# Patient Record
Sex: Female | Born: 1975
Health system: Southern US, Community
[De-identification: ages and names within clinical notes are randomized; demographics above are authoritative.]

## PROBLEM LIST (undated history)

## (undated) DIAGNOSIS — I10 Essential (primary) hypertension: Secondary | ICD-10-CM

## (undated) DIAGNOSIS — G932 Benign intracranial hypertension: Secondary | ICD-10-CM

## (undated) DIAGNOSIS — F419 Anxiety disorder, unspecified: Secondary | ICD-10-CM

## (undated) DIAGNOSIS — R6889 Other general symptoms and signs: Secondary | ICD-10-CM

## (undated) DIAGNOSIS — F329 Major depressive disorder, single episode, unspecified: Secondary | ICD-10-CM

## (undated) DIAGNOSIS — D259 Leiomyoma of uterus, unspecified: Secondary | ICD-10-CM

## (undated) DIAGNOSIS — F32A Depression, unspecified: Secondary | ICD-10-CM

## (undated) HISTORY — DX: Depression, unspecified: F32.A

## (undated) HISTORY — DX: Benign intracranial hypertension: G93.2

## (undated) HISTORY — DX: Anxiety disorder, unspecified: F41.9

## (undated) HISTORY — DX: Essential (primary) hypertension: I10

## (undated) HISTORY — DX: Leiomyoma of uterus, unspecified: D25.9

## (undated) HISTORY — DX: Other general symptoms and signs: R68.89

---
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
## 1999-12-24 HISTORY — PX: TUBAL LIGATION: SHX77

## 2003-12-24 HISTORY — PX: MOUTH SURGERY: SHX715

## 2007-03-31 ENCOUNTER — Ambulatory Visit: Payer: Self-pay | Admitting: Neurology

## 2013-12-23 HISTORY — PX: CHOLECYSTECTOMY: SHX55

## 2016-11-11 DIAGNOSIS — B373 Candidiasis of vulva and vagina: Secondary | ICD-10-CM | POA: Diagnosis not present

## 2016-11-11 DIAGNOSIS — R350 Frequency of micturition: Secondary | ICD-10-CM | POA: Diagnosis not present

## 2017-11-21 DIAGNOSIS — Z113 Encounter for screening for infections with a predominantly sexual mode of transmission: Secondary | ICD-10-CM | POA: Diagnosis not present

## 2017-11-21 DIAGNOSIS — N760 Acute vaginitis: Secondary | ICD-10-CM | POA: Diagnosis not present

## 2017-11-21 DIAGNOSIS — Z118 Encounter for screening for other infectious and parasitic diseases: Secondary | ICD-10-CM | POA: Diagnosis not present

## 2017-12-08 DIAGNOSIS — R42 Dizziness and giddiness: Secondary | ICD-10-CM | POA: Diagnosis not present

## 2017-12-08 DIAGNOSIS — J019 Acute sinusitis, unspecified: Secondary | ICD-10-CM | POA: Diagnosis not present

## 2017-12-08 DIAGNOSIS — J029 Acute pharyngitis, unspecified: Secondary | ICD-10-CM | POA: Diagnosis not present

## 2017-12-08 DIAGNOSIS — G932 Benign intracranial hypertension: Secondary | ICD-10-CM | POA: Diagnosis not present

## 2018-02-16 DIAGNOSIS — D485 Neoplasm of uncertain behavior of skin: Secondary | ICD-10-CM | POA: Diagnosis not present

## 2018-02-16 DIAGNOSIS — L219 Seborrheic dermatitis, unspecified: Secondary | ICD-10-CM | POA: Diagnosis not present

## 2018-02-16 DIAGNOSIS — D229 Melanocytic nevi, unspecified: Secondary | ICD-10-CM | POA: Diagnosis not present

## 2018-10-16 DIAGNOSIS — Z1231 Encounter for screening mammogram for malignant neoplasm of breast: Secondary | ICD-10-CM | POA: Diagnosis not present

## 2019-02-25 DIAGNOSIS — Z0000 Encounter for general adult medical examination without abnormal findings: Secondary | ICD-10-CM | POA: Diagnosis not present

## 2019-02-25 DIAGNOSIS — B351 Tinea unguium: Secondary | ICD-10-CM | POA: Diagnosis not present

## 2019-02-25 DIAGNOSIS — Z6838 Body mass index (BMI) 38.0-38.9, adult: Secondary | ICD-10-CM | POA: Diagnosis not present

## 2019-02-25 DIAGNOSIS — B353 Tinea pedis: Secondary | ICD-10-CM | POA: Diagnosis not present

## 2019-03-24 DIAGNOSIS — R42 Dizziness and giddiness: Secondary | ICD-10-CM | POA: Diagnosis not present

## 2019-03-24 DIAGNOSIS — I10 Essential (primary) hypertension: Secondary | ICD-10-CM | POA: Diagnosis not present

## 2019-05-30 ENCOUNTER — Emergency Department (HOSPITAL_COMMUNITY)
Admission: EM | Admit: 2019-05-30 | Discharge: 2019-05-30 | Disposition: A | Discharge status: Home / Self Care | Payer: BC Managed Care – PPO | Attending: Emergency Medicine | Admitting: Emergency Medicine

## 2019-05-30 ENCOUNTER — Encounter (HOSPITAL_COMMUNITY): Payer: Self-pay | Admitting: *Deleted

## 2019-05-30 ENCOUNTER — Other Ambulatory Visit: Payer: Self-pay

## 2019-05-30 DIAGNOSIS — Y929 Unspecified place or not applicable: Secondary | ICD-10-CM | POA: Diagnosis not present

## 2019-05-30 DIAGNOSIS — Y939 Activity, unspecified: Secondary | ICD-10-CM | POA: Insufficient documentation

## 2019-05-30 DIAGNOSIS — Y999 Unspecified external cause status: Secondary | ICD-10-CM | POA: Insufficient documentation

## 2019-05-30 DIAGNOSIS — S40811A Abrasion of right upper arm, initial encounter: Secondary | ICD-10-CM | POA: Diagnosis not present

## 2019-05-30 DIAGNOSIS — Z23 Encounter for immunization: Secondary | ICD-10-CM | POA: Insufficient documentation

## 2019-05-30 DIAGNOSIS — S40211A Abrasion of right shoulder, initial encounter: Secondary | ICD-10-CM | POA: Diagnosis not present

## 2019-05-30 DIAGNOSIS — X58XXXA Exposure to other specified factors, initial encounter: Secondary | ICD-10-CM | POA: Insufficient documentation

## 2019-05-30 DIAGNOSIS — S4991XA Unspecified injury of right shoulder and upper arm, initial encounter: Secondary | ICD-10-CM | POA: Diagnosis not present

## 2019-05-30 MED ORDER — TETANUS-DIPHTH-ACELL PERTUSSIS 5-2.5-18.5 LF-MCG/0.5 IM SUSP
0.5000 mL | Freq: Once | INTRAMUSCULAR | Status: AC
  Administered 2019-05-30: 0.5 mL via INTRAMUSCULAR
  Filled 2019-05-30: qty 0.5

## 2019-05-30 NOTE — Discharge Instructions (Addendum)
Return if any problems.
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
## 2019-08-06 DIAGNOSIS — N760 Acute vaginitis: Secondary | ICD-10-CM | POA: Diagnosis not present

## 2019-10-07 DIAGNOSIS — Z209 Contact with and (suspected) exposure to unspecified communicable disease: Secondary | ICD-10-CM | POA: Diagnosis not present

## 2019-10-07 DIAGNOSIS — Z6837 Body mass index (BMI) 37.0-37.9, adult: Secondary | ICD-10-CM | POA: Diagnosis not present

## 2019-10-07 DIAGNOSIS — L309 Dermatitis, unspecified: Secondary | ICD-10-CM | POA: Diagnosis not present

## 2019-10-29 DIAGNOSIS — Z1231 Encounter for screening mammogram for malignant neoplasm of breast: Secondary | ICD-10-CM | POA: Diagnosis not present

## 2019-10-30 DIAGNOSIS — Z209 Contact with and (suspected) exposure to unspecified communicable disease: Secondary | ICD-10-CM

## 2019-10-30 HISTORY — DX: Contact with and (suspected) exposure to unspecified communicable disease: Z20.9

## 2019-11-01 ENCOUNTER — Other Ambulatory Visit: Payer: Self-pay

## 2019-11-01 ENCOUNTER — Ambulatory Visit
Admission: EM | Admit: 2019-11-01 | Discharge: 2019-11-01 | Disposition: A | Discharge status: Home / Self Care | Payer: BC Managed Care – PPO

## 2019-11-01 DIAGNOSIS — Z711 Person with feared health complaint in whom no diagnosis is made: Secondary | ICD-10-CM | POA: Diagnosis not present

## 2019-11-01 NOTE — Discharge Instructions (Signed)
Discussed patient case with Dr. Lanny Cramp We are not concerned for rabies at this time because there is no visible bite or puncture wound Follow up with PCP as needed
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
## 2019-11-07 DIAGNOSIS — M25562 Pain in left knee: Secondary | ICD-10-CM | POA: Diagnosis not present

## 2019-11-07 DIAGNOSIS — Z203 Contact with and (suspected) exposure to rabies: Secondary | ICD-10-CM | POA: Diagnosis not present

## 2019-11-07 DIAGNOSIS — Z882 Allergy status to sulfonamides status: Secondary | ICD-10-CM | POA: Diagnosis not present

## 2019-11-07 DIAGNOSIS — Z2914 Encounter for prophylactic rabies immune globin: Secondary | ICD-10-CM | POA: Diagnosis not present

## 2019-11-12 DIAGNOSIS — I889 Nonspecific lymphadenitis, unspecified: Secondary | ICD-10-CM | POA: Diagnosis not present

## 2019-11-12 DIAGNOSIS — Z6837 Body mass index (BMI) 37.0-37.9, adult: Secondary | ICD-10-CM | POA: Diagnosis not present

## 2019-11-19 DIAGNOSIS — R61 Generalized hyperhidrosis: Secondary | ICD-10-CM | POA: Diagnosis not present

## 2019-11-19 DIAGNOSIS — Z203 Contact with and (suspected) exposure to rabies: Secondary | ICD-10-CM | POA: Diagnosis not present

## 2019-11-19 DIAGNOSIS — R531 Weakness: Secondary | ICD-10-CM | POA: Diagnosis not present

## 2019-11-19 DIAGNOSIS — Z2914 Encounter for prophylactic rabies immune globin: Secondary | ICD-10-CM | POA: Diagnosis not present

## 2019-11-19 DIAGNOSIS — Z79899 Other long term (current) drug therapy: Secondary | ICD-10-CM | POA: Diagnosis not present

## 2019-11-19 DIAGNOSIS — F419 Anxiety disorder, unspecified: Secondary | ICD-10-CM | POA: Diagnosis not present

## 2019-11-22 DIAGNOSIS — R251 Tremor, unspecified: Secondary | ICD-10-CM | POA: Diagnosis not present

## 2019-11-22 DIAGNOSIS — Z203 Contact with and (suspected) exposure to rabies: Secondary | ICD-10-CM | POA: Diagnosis not present

## 2019-11-22 DIAGNOSIS — Z2914 Encounter for prophylactic rabies immune globin: Secondary | ICD-10-CM | POA: Diagnosis not present

## 2019-11-22 DIAGNOSIS — Z209 Contact with and (suspected) exposure to unspecified communicable disease: Secondary | ICD-10-CM | POA: Diagnosis not present

## 2019-11-22 DIAGNOSIS — I10 Essential (primary) hypertension: Secondary | ICD-10-CM | POA: Diagnosis not present

## 2019-11-23 ENCOUNTER — Emergency Department (HOSPITAL_COMMUNITY): Payer: BC Managed Care – PPO

## 2019-11-23 ENCOUNTER — Emergency Department (HOSPITAL_COMMUNITY)
Admission: EM | Admit: 2019-11-23 | Discharge: 2019-11-23 | Disposition: A | Discharge status: Home / Self Care | Payer: BC Managed Care – PPO | Attending: Emergency Medicine | Admitting: Emergency Medicine

## 2019-11-23 ENCOUNTER — Other Ambulatory Visit: Payer: Self-pay

## 2019-11-23 ENCOUNTER — Encounter (HOSPITAL_COMMUNITY): Payer: Self-pay

## 2019-11-23 DIAGNOSIS — R6883 Chills (without fever): Secondary | ICD-10-CM | POA: Insufficient documentation

## 2019-11-23 DIAGNOSIS — Z203 Contact with and (suspected) exposure to rabies: Secondary | ICD-10-CM

## 2019-11-23 DIAGNOSIS — Z3202 Encounter for pregnancy test, result negative: Secondary | ICD-10-CM | POA: Diagnosis not present

## 2019-11-23 DIAGNOSIS — R251 Tremor, unspecified: Secondary | ICD-10-CM | POA: Diagnosis not present

## 2019-11-23 HISTORY — DX: Benign intracranial hypertension: G93.2

## 2019-11-23 LAB — COMPREHENSIVE METABOLIC PANEL
ALT: 15 U/L (ref 0–44)
AST: 16 U/L (ref 15–41)
Albumin: 4.1 g/dL (ref 3.5–5.0)
Alkaline Phosphatase: 103 U/L (ref 38–126)
Anion gap: 10 (ref 5–15)
BUN: 10 mg/dL (ref 6–20)
CO2: 22 mmol/L (ref 22–32)
Calcium: 9 mg/dL (ref 8.9–10.3)
Chloride: 106 mmol/L (ref 98–111)
Creatinine, Ser: 0.81 mg/dL (ref 0.44–1.00)
GFR calc Af Amer: >60 mL/min (ref >60)
GFR calc non Af Amer: >60 mL/min (ref >60)
Glucose, Bld: 101 mg/dL — ABNORMAL HIGH (ref 70–99)
Potassium: 3.8 mmol/L (ref 3.5–5.1)
Sodium: 138 mmol/L (ref 135–145)
Total Bilirubin: 0.6 mg/dL (ref 0.3–1.2)
Total Protein: 7.6 g/dL (ref 6.5–8.1)

## 2019-11-23 LAB — URINALYSIS, ROUTINE W REFLEX MICROSCOPIC
Bacteria, UA: NONE SEEN
Bilirubin Urine: NEGATIVE
Glucose, UA: NEGATIVE mg/dL
Ketones, ur: NEGATIVE mg/dL
Leukocytes,Ua: NEGATIVE
Nitrite: NEGATIVE
Protein, ur: NEGATIVE mg/dL
Specific Gravity, Urine: 1.018 (ref 1.005–1.030)
pH: 6 (ref 5.0–8.0)

## 2019-11-23 LAB — CBC WITH DIFFERENTIAL/PLATELET
Abs Immature Granulocytes: 0.02 10*3/uL (ref 0.00–0.07)
Basophils Absolute: 0 10*3/uL (ref 0.0–0.1)
Basophils Relative: 0 %
Eosinophils Absolute: 0.1 10*3/uL (ref 0.0–0.5)
Eosinophils Relative: 1 %
HCT: 41.3 % (ref 36.0–46.0)
Hemoglobin: 13.1 g/dL (ref 12.0–15.0)
Immature Granulocytes: 0 %
Lymphocytes Relative: 13 %
Lymphs Abs: 1.2 10*3/uL (ref 0.7–4.0)
MCH: 26.7 pg (ref 26.0–34.0)
MCHC: 31.7 g/dL (ref 30.0–36.0)
MCV: 84.3 fL (ref 80.0–100.0)
Monocytes Absolute: 0.8 10*3/uL (ref 0.1–1.0)
Monocytes Relative: 9 %
Neutro Abs: 6.6 10*3/uL (ref 1.7–7.7)
Neutrophils Relative %: 77 %
Platelets: 412 10*3/uL — ABNORMAL HIGH (ref 150–400)
RBC: 4.9 MIL/uL (ref 3.87–5.11)
RDW: 14.1 % (ref 11.5–15.5)
WBC: 8.7 10*3/uL (ref 4.0–10.5)
nRBC: 0 % (ref 0.0–0.2)

## 2019-11-23 LAB — LIPASE, BLOOD: Lipase: 23 U/L (ref 11–51)

## 2019-11-23 LAB — PREGNANCY, URINE: Preg Test, Ur: NEGATIVE
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
## 2019-11-23 NOTE — Discharge Instructions (Addendum)
MRI brain without any acute findings.  Labs with a significant abnormalities.  As we discussed infectious disease states that the rabies immunoglobulin that she got would prevent any serious rabies illness and would eradicate it very quickly.  Does recommend you continue the rabies vaccine series.  If the twitching continues would recommend following up with primary care doctor may require neurology referral.  But should not be related to rabies.

## 2019-11-26 DIAGNOSIS — Z203 Contact with and (suspected) exposure to rabies: Secondary | ICD-10-CM | POA: Diagnosis not present

## 2019-11-26 DIAGNOSIS — Z23 Encounter for immunization: Secondary | ICD-10-CM | POA: Diagnosis not present

## 2019-11-29 ENCOUNTER — Other Ambulatory Visit: Payer: Self-pay

## 2019-11-29 ENCOUNTER — Encounter: Payer: Self-pay | Admitting: Neurology

## 2019-11-29 ENCOUNTER — Ambulatory Visit: Payer: BC Managed Care – PPO | Admitting: Neurology

## 2019-11-29 VITALS — BP 131/87 | HR 93 | Temp 97.8°F | Ht 62.0 in | Wt 197.0 lb

## 2019-11-29 DIAGNOSIS — G959 Disease of spinal cord, unspecified: Secondary | ICD-10-CM

## 2019-11-29 DIAGNOSIS — R202 Paresthesia of skin: Secondary | ICD-10-CM | POA: Diagnosis not present

## 2019-11-29 DIAGNOSIS — R7309 Other abnormal glucose: Secondary | ICD-10-CM | POA: Diagnosis not present

## 2019-11-29 DIAGNOSIS — G932 Benign intracranial hypertension: Secondary | ICD-10-CM

## 2019-11-29 DIAGNOSIS — R270 Ataxia, unspecified: Secondary | ICD-10-CM

## 2019-11-29 DIAGNOSIS — R5383 Other fatigue: Secondary | ICD-10-CM

## 2019-11-29 DIAGNOSIS — M6281 Muscle weakness (generalized): Secondary | ICD-10-CM

## 2019-11-29 DIAGNOSIS — R292 Abnormal reflex: Secondary | ICD-10-CM

## 2019-11-29 DIAGNOSIS — R251 Tremor, unspecified: Secondary | ICD-10-CM | POA: Diagnosis not present

## 2019-11-29 DIAGNOSIS — R799 Abnormal finding of blood chemistry, unspecified: Secondary | ICD-10-CM | POA: Diagnosis not present

## 2019-11-29 DIAGNOSIS — Z7721 Contact with and (suspected) exposure to potentially hazardous body fluids: Secondary | ICD-10-CM

## 2019-11-29 DIAGNOSIS — M4712 Other spondylosis with myelopathy, cervical region: Secondary | ICD-10-CM
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
## 2019-11-29 NOTE — Patient Instructions (Signed)
Bloodwork MRI cervical spine

## 2019-12-02 DIAGNOSIS — R531 Weakness: Secondary | ICD-10-CM | POA: Diagnosis not present

## 2019-12-02 DIAGNOSIS — R251 Tremor, unspecified: Secondary | ICD-10-CM | POA: Diagnosis not present

## 2019-12-02 DIAGNOSIS — F419 Anxiety disorder, unspecified: Secondary | ICD-10-CM | POA: Diagnosis not present

## 2019-12-02 DIAGNOSIS — R5383 Other fatigue: Secondary | ICD-10-CM | POA: Diagnosis not present

## 2019-12-03 DIAGNOSIS — Z23 Encounter for immunization: Secondary | ICD-10-CM | POA: Diagnosis not present

## 2019-12-03 DIAGNOSIS — Z203 Contact with and (suspected) exposure to rabies: Secondary | ICD-10-CM | POA: Diagnosis not present

## 2019-12-04 LAB — COMPREHENSIVE METABOLIC PANEL
ALT: 13 IU/L (ref 0–32)
AST: 12 IU/L (ref 0–40)
Albumin/Globulin Ratio: 1.7 (ref 1.2–2.2)
Albumin: 4.3 g/dL (ref 3.8–4.8)
Alkaline Phosphatase: 115 IU/L (ref 39–117)
BUN/Creatinine Ratio: 7 — ABNORMAL LOW (ref 9–23)
BUN: 6 mg/dL (ref 6–24)
Bilirubin Total: 0.3 mg/dL (ref 0.0–1.2)
CO2: 23 mmol/L (ref 20–29)
Calcium: 9.8 mg/dL (ref 8.7–10.2)
Chloride: 105 mmol/L (ref 96–106)
Creatinine, Ser: 0.89 mg/dL (ref 0.57–1.00)
GFR calc Af Amer: 92 mL/min/{1.73_m2} (ref >59)
GFR calc non Af Amer: 80 mL/min/{1.73_m2} (ref >59)
Globulin, Total: 2.6 g/dL (ref 1.5–4.5)
Glucose: 86 mg/dL (ref 65–99)
Potassium: 4.1 mmol/L (ref 3.5–5.2)
Sodium: 141 mmol/L (ref 134–144)
Total Protein: 6.9 g/dL (ref 6.0–8.5)

## 2019-12-04 LAB — CBC
Hematocrit: 38.4 % (ref 34.0–46.6)
Hemoglobin: 12.6 g/dL (ref 11.1–15.9)
MCH: 27 pg (ref 26.6–33.0)
MCHC: 32.8 g/dL (ref 31.5–35.7)
MCV: 82 fL (ref 79–97)
Platelets: 407 10*3/uL (ref 150–450)
RBC: 4.66 x10E6/uL (ref 3.77–5.28)
RDW: 14.5 % (ref 11.7–15.4)
WBC: 10.4 10*3/uL (ref 3.4–10.8)

## 2019-12-04 LAB — HEAVY METALS, BLOOD
Arsenic: 6 ug/L (ref 2–23)
Lead, Blood: <1 ug/dL (ref 0–4)
Mercury: <1 ug/L (ref 0.0–14.9)

## 2019-12-04 LAB — SAR COV2 SEROLOGY (COVID19)AB(IGG),IA: DiaSorin SARS-CoV-2 Ab, IgG: NEGATIVE

## 2019-12-04 LAB — HEPATITIS PANEL, ACUTE
Hep A IgM: NEGATIVE
Hep B C IgM: NEGATIVE
Hep C Virus Ab: <0.1 s/co ratio (ref 0.0–0.9)
Hepatitis B Surface Ag: NEGATIVE

## 2019-12-04 LAB — HEMOGLOBIN A1C
Est. average glucose Bld gHb Est-mCnc: 108 mg/dL
Hgb A1c MFr Bld: 5.4 % (ref 4.8–5.6)

## 2019-12-04 LAB — SEDIMENTATION RATE: Sed Rate: 32 mm/hr (ref 0–32)

## 2019-12-04 LAB — HSV(HERPES SIMPLEX VRS) I + II AB-IGG
HSV 1 Glycoprotein G Ab, IgG: 7.5 index — ABNORMAL HIGH (ref 0.00–0.90)
HSV 2 IgG, Type Spec: <0.91 index (ref 0.00–0.90)

## 2019-12-04 LAB — B12 AND FOLATE PANEL
Folate: 5.3 ng/mL (ref >3.0)
Vitamin B-12: 402 pg/mL (ref 232–1245)

## 2019-12-04 LAB — TSH: TSH: 0.68 u[IU]/mL (ref 0.450–4.500)

## 2019-12-04 LAB — HIV ANTIBODY (ROUTINE TESTING W REFLEX): HIV Screen 4th Generation wRfx: NONREACTIVE

## 2019-12-04 LAB — RPR: RPR Ser Ql: NONREACTIVE

## 2019-12-04 LAB — VITAMIN B1: Thiamine: 108 nmol/L (ref 66.5–200.0)

## 2019-12-04 LAB — VITAMIN B6: Vitamin B6: 3.1 ug/L (ref 2.0–32.8)

## 2019-12-07 DIAGNOSIS — F419 Anxiety disorder, unspecified: Secondary | ICD-10-CM | POA: Diagnosis not present

## 2019-12-07 DIAGNOSIS — R42 Dizziness and giddiness: Secondary | ICD-10-CM | POA: Diagnosis not present

## 2019-12-07 DIAGNOSIS — R000 Tachycardia, unspecified: Secondary | ICD-10-CM | POA: Diagnosis not present

## 2019-12-07 DIAGNOSIS — R531 Weakness: Secondary | ICD-10-CM | POA: Diagnosis not present

## 2019-12-07 DIAGNOSIS — R251 Tremor, unspecified: Secondary | ICD-10-CM | POA: Diagnosis not present

## 2019-12-09 ENCOUNTER — Telehealth: Payer: Self-pay | Admitting: Neurology

## 2019-12-09 NOTE — Telephone Encounter (Signed)
Pt is asking for a call back from [place] re: the MRI on her cervical spine, please call.

## 2019-12-12 ENCOUNTER — Other Ambulatory Visit: Payer: Self-pay

## 2019-12-12 ENCOUNTER — Encounter (HOSPITAL_COMMUNITY): Payer: Self-pay | Admitting: Emergency Medicine

## 2019-12-12 ENCOUNTER — Emergency Department (HOSPITAL_COMMUNITY)
Admission: EM | Admit: 2019-12-12 | Discharge: 2019-12-12 | Disposition: A | Discharge status: Home / Self Care | Payer: BC Managed Care – PPO | Attending: Emergency Medicine | Admitting: Emergency Medicine

## 2019-12-12 DIAGNOSIS — R197 Diarrhea, unspecified: Secondary | ICD-10-CM | POA: Diagnosis not present

## 2019-12-12 DIAGNOSIS — R531 Weakness: Secondary | ICD-10-CM | POA: Insufficient documentation

## 2019-12-12 DIAGNOSIS — Z87891 Personal history of nicotine dependence: Secondary | ICD-10-CM | POA: Diagnosis not present

## 2019-12-12 DIAGNOSIS — Z20828 Contact with and (suspected) exposure to other viral communicable diseases: Secondary | ICD-10-CM | POA: Diagnosis not present

## 2019-12-12 DIAGNOSIS — I10 Essential (primary) hypertension: Secondary | ICD-10-CM | POA: Insufficient documentation

## 2019-12-12 LAB — CBC WITH DIFFERENTIAL/PLATELET
Abs Immature Granulocytes: 0.03 10*3/uL (ref 0.00–0.07)
Basophils Absolute: 0 10*3/uL (ref 0.0–0.1)
Basophils Relative: 0 %
Eosinophils Absolute: 0.2 10*3/uL (ref 0.0–0.5)
Eosinophils Relative: 2 %
HCT: 40.9 % (ref 36.0–46.0)
Hemoglobin: 12.8 g/dL (ref 12.0–15.0)
Immature Granulocytes: 0 %
Lymphocytes Relative: 16 %
Lymphs Abs: 1.5 10*3/uL (ref 0.7–4.0)
MCH: 27 pg (ref 26.0–34.0)
MCHC: 31.3 g/dL (ref 30.0–36.0)
MCV: 86.3 fL (ref 80.0–100.0)
Monocytes Absolute: 0.9 10*3/uL (ref 0.1–1.0)
Monocytes Relative: 9 %
Neutro Abs: 7.3 10*3/uL (ref 1.7–7.7)
Neutrophils Relative %: 73 %
Platelets: 433 10*3/uL — ABNORMAL HIGH (ref 150–400)
RBC: 4.74 MIL/uL (ref 3.87–5.11)
RDW: 14.3 % (ref 11.5–15.5)
WBC: 10 10*3/uL (ref 4.0–10.5)
nRBC: 0 % (ref 0.0–0.2)

## 2019-12-12 LAB — URINALYSIS, ROUTINE W REFLEX MICROSCOPIC
Bilirubin Urine: NEGATIVE
Glucose, UA: NEGATIVE mg/dL
Ketones, ur: 20 mg/dL — AB
Leukocytes,Ua: NEGATIVE
Nitrite: NEGATIVE
Protein, ur: 30 mg/dL — AB
Specific Gravity, Urine: 1.028 (ref 1.005–1.030)
pH: 5 (ref 5.0–8.0)

## 2019-12-12 LAB — BASIC METABOLIC PANEL
Anion gap: 9 (ref 5–15)
BUN: 9 mg/dL (ref 6–20)
CO2: 25 mmol/L (ref 22–32)
Calcium: 9.1 mg/dL (ref 8.9–10.3)
Chloride: 105 mmol/L (ref 98–111)
Creatinine, Ser: 0.86 mg/dL (ref 0.44–1.00)
GFR calc Af Amer: >60 mL/min (ref >60)
GFR calc non Af Amer: >60 mL/min (ref >60)
Glucose, Bld: 96 mg/dL (ref 70–99)
Potassium: 3.9 mmol/L (ref 3.5–5.1)
Sodium: 139 mmol/L (ref 135–145)

## 2019-12-12 LAB — PREGNANCY, URINE: Preg Test, Ur: NEGATIVE

## 2019-12-12 LAB — D-DIMER, QUANTITATIVE: D-Dimer, Quant: 0.36 ug/mL-FEU (ref 0.00–0.50)
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
## 2019-12-12 NOTE — Discharge Instructions (Signed)
Call your primary doctor to schedule a follow-up appointment.
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
## 2019-12-14 ENCOUNTER — Other Ambulatory Visit: Payer: BC Managed Care – PPO

## 2019-12-14 ENCOUNTER — Ambulatory Visit: Payer: BC Managed Care – PPO | Admitting: Diagnostic Neuroimaging

## 2019-12-14 LAB — NOVEL CORONAVIRUS, NAA (HOSP ORDER, SEND-OUT TO REF LAB; TAT 18-24 HRS): SARS-CoV-2, NAA: NOT DETECTED

## 2019-12-14 LAB — URINE CULTURE

## 2019-12-15 DIAGNOSIS — R251 Tremor, unspecified: Secondary | ICD-10-CM | POA: Diagnosis not present

## 2019-12-15 DIAGNOSIS — R002 Palpitations: Secondary | ICD-10-CM | POA: Diagnosis not present

## 2019-12-15 DIAGNOSIS — R112 Nausea with vomiting, unspecified: Secondary | ICD-10-CM | POA: Diagnosis not present

## 2019-12-15 DIAGNOSIS — R110 Nausea: Secondary | ICD-10-CM | POA: Diagnosis not present

## 2019-12-15 DIAGNOSIS — F411 Generalized anxiety disorder: Secondary | ICD-10-CM | POA: Diagnosis not present

## 2019-12-15 DIAGNOSIS — G932 Benign intracranial hypertension: Secondary | ICD-10-CM | POA: Diagnosis not present

## 2019-12-15 DIAGNOSIS — N179 Acute kidney failure, unspecified: Secondary | ICD-10-CM | POA: Diagnosis not present

## 2019-12-15 DIAGNOSIS — R6883 Chills (without fever): Secondary | ICD-10-CM | POA: Diagnosis not present

## 2019-12-15 DIAGNOSIS — R197 Diarrhea, unspecified: Secondary | ICD-10-CM | POA: Diagnosis not present

## 2019-12-15 DIAGNOSIS — I10 Essential (primary) hypertension: Secondary | ICD-10-CM | POA: Diagnosis not present

## 2019-12-21 ENCOUNTER — Telehealth: Payer: Self-pay | Admitting: Neurology

## 2019-12-21 DIAGNOSIS — R251 Tremor, unspecified: Secondary | ICD-10-CM | POA: Diagnosis not present

## 2019-12-21 DIAGNOSIS — Z882 Allergy status to sulfonamides status: Secondary | ICD-10-CM | POA: Diagnosis not present

## 2019-12-21 DIAGNOSIS — R531 Weakness: Secondary | ICD-10-CM | POA: Diagnosis not present

## 2019-12-21 DIAGNOSIS — R42 Dizziness and giddiness: Secondary | ICD-10-CM | POA: Diagnosis not present

## 2019-12-21 DIAGNOSIS — R202 Paresthesia of skin: Secondary | ICD-10-CM | POA: Diagnosis not present

## 2019-12-21 DIAGNOSIS — G252 Other specified forms of tremor: Secondary | ICD-10-CM | POA: Diagnosis not present

## 2019-12-21 DIAGNOSIS — G932 Benign intracranial hypertension: Secondary | ICD-10-CM | POA: Diagnosis not present

## 2019-12-21 DIAGNOSIS — M50221 Other cervical disc displacement at C4-C5 level: Secondary | ICD-10-CM | POA: Diagnosis not present

## 2019-12-21 NOTE — Telephone Encounter (Signed)
I have talked with Dr Sharia Reeve with [facility] ED   Patient presented with left side tremor, weakness all over, movement sensation from heard to her spine. She has LP, open pressure 29 cm H2O.  MRI brain on Nov 23 2019 showed no acute abnormalities.

## 2019-12-28 ENCOUNTER — Other Ambulatory Visit: Payer: Self-pay

## 2019-12-28 ENCOUNTER — Ambulatory Visit (INDEPENDENT_AMBULATORY_CARE_PROVIDER_SITE_OTHER): Payer: BC Managed Care – PPO | Admitting: Internal Medicine

## 2019-12-28 ENCOUNTER — Encounter (INDEPENDENT_AMBULATORY_CARE_PROVIDER_SITE_OTHER): Payer: Self-pay | Admitting: Internal Medicine

## 2019-12-28 ENCOUNTER — Telehealth: Payer: Self-pay

## 2019-12-28 VITALS — BP 150/100 | HR 103 | Ht 62.0 in | Wt 192.8 lb

## 2019-12-28 DIAGNOSIS — G932 Benign intracranial hypertension: Secondary | ICD-10-CM

## 2019-12-28 DIAGNOSIS — R251 Tremor, unspecified: Secondary | ICD-10-CM

## 2019-12-28 NOTE — Telephone Encounter (Signed)
COVID-19 Pre-Screening Questions:  Do you currently have a fever (>100 F), chills or unexplained body aches? NO   Are you currently experiencing new cough, shortness of breath, sore throat, runny nose?NO .  Have you recently travelled outside the state of  in the last 14 days? NO .  Have you been in contact with someone that is currently pending confirmation of Covid19 testing or has been confirmed to have the Covid19 virus?  NO  **If the patient answers NO to ALL questions -  advise the patient to please call the clinic before coming to the office should any symptoms develop.     
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
## 2019-12-29 ENCOUNTER — Encounter: Payer: Self-pay | Admitting: Internal Medicine

## 2019-12-29 ENCOUNTER — Telehealth (INDEPENDENT_AMBULATORY_CARE_PROVIDER_SITE_OTHER): Payer: Self-pay

## 2019-12-29 ENCOUNTER — Ambulatory Visit (INDEPENDENT_AMBULATORY_CARE_PROVIDER_SITE_OTHER): Payer: BC Managed Care – PPO | Admitting: Internal Medicine

## 2019-12-29 ENCOUNTER — Encounter: Payer: Self-pay | Admitting: Neurology

## 2019-12-29 VITALS — BP 134/88 | HR 87 | Temp 98.0°F | Ht 62.0 in | Wt 193.0 lb

## 2019-12-29 DIAGNOSIS — G629 Polyneuropathy, unspecified: Secondary | ICD-10-CM

## 2019-12-29 DIAGNOSIS — Z209 Contact with and (suspected) exposure to unspecified communicable disease: Secondary | ICD-10-CM | POA: Diagnosis not present

## 2019-12-29 DIAGNOSIS — Z20822 Contact with and (suspected) exposure to covid-19: Secondary | ICD-10-CM | POA: Diagnosis not present

## 2019-12-29 NOTE — Telephone Encounter (Signed)
-----   Message from Alfonzo Beers sent at 12/29/2019  1:45 PM EST ----- Regarding: Medication Question Patient called and LVM, she was a new patient yesterday and was referred to a neurologist and can't see them until Feb.  She had a question about her medication and wants to talk to the nurse when you get a chance.
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
## 2019-12-29 NOTE — Progress Notes (Signed)
See note attached with encounter

## 2019-12-29 NOTE — Patient Instructions (Signed)
Referral sent for Neurologist. They will call you to make appointments. Blood work today.  Follow if needed.

## 2019-12-31 NOTE — Telephone Encounter (Signed)
Tell the patient that I am still waiting to look at the note from Dr. Graylon Good, infectious disease and we can discuss changing medications at the next visit.

## 2020-01-03 NOTE — Telephone Encounter (Signed)
Return call to Vanessa Harrington on medication request; Gave her verbal message from Dr Anastasio Champion he is still waiting on the note  from Dr Graylon Good  and will discuss it over next appt in office.

## 2020-01-06 DIAGNOSIS — G932 Benign intracranial hypertension: Secondary | ICD-10-CM | POA: Diagnosis not present

## 2020-01-07 DIAGNOSIS — Z299 Encounter for prophylactic measures, unspecified: Secondary | ICD-10-CM | POA: Diagnosis not present

## 2020-01-07 DIAGNOSIS — Z713 Dietary counseling and surveillance: Secondary | ICD-10-CM | POA: Diagnosis not present

## 2020-01-07 DIAGNOSIS — R251 Tremor, unspecified: Secondary | ICD-10-CM | POA: Diagnosis not present

## 2020-01-07 DIAGNOSIS — Z6833 Body mass index (BMI) 33.0-33.9, adult: Secondary | ICD-10-CM | POA: Diagnosis not present

## 2020-01-07 DIAGNOSIS — Z87891 Personal history of nicotine dependence: Secondary | ICD-10-CM | POA: Diagnosis not present

## 2020-01-10 ENCOUNTER — Telehealth: Payer: Self-pay

## 2020-01-10 LAB — CBC WITH DIFFERENTIAL/PLATELET
Absolute Monocytes: 622 cells/uL (ref 200–950)
Basophils Absolute: 52 cells/uL (ref 0–200)
Basophils Relative: 0.7 %
Eosinophils Absolute: 192 cells/uL (ref 15–500)
Eosinophils Relative: 2.6 %
HCT: 35.8 % (ref 35.0–45.0)
Hemoglobin: 11.7 g/dL (ref 11.7–15.5)
Lymphs Abs: 1310 cells/uL (ref 850–3900)
MCH: 26.9 pg — ABNORMAL LOW (ref 27.0–33.0)
MCHC: 32.7 g/dL (ref 32.0–36.0)
MCV: 82.3 fL (ref 80.0–100.0)
MPV: 10.1 fL (ref 7.5–12.5)
Monocytes Relative: 8.4 %
Neutro Abs: 5224 cells/uL (ref 1500–7800)
Neutrophils Relative %: 70.6 %
Platelets: 410 10*3/uL — ABNORMAL HIGH (ref 140–400)
RBC: 4.35 10*6/uL (ref 3.80–5.10)
RDW: 13.9 % (ref 11.0–15.0)
Total Lymphocyte: 17.7 %
WBC: 7.4 10*3/uL (ref 3.8–10.8)

## 2020-01-10 LAB — RPR: RPR Ser Ql: NONREACTIVE

## 2020-01-10 LAB — SEDIMENTATION RATE: Sed Rate: 22 mm/h — ABNORMAL HIGH (ref 0–20)

## 2020-01-10 LAB — C-REACTIVE PROTEIN: CRP: 1.5 mg/L (ref <8.0)

## 2020-01-10 LAB — SAR COV2 SEROLOGY (COVID19)AB(IGG),IA: SARS CoV2 AB IGG: NEGATIVE

## 2020-01-10 LAB — RABIES VIRUS ANTIBODY TITER: Rabies Response: 0.2 IU/mL

## 2020-01-10 NOTE — Telephone Encounter (Signed)
Patient called office today to follow up on Rabies Antibody lab. Patient would like to know since her level came back a 0.2 would she needs another dose of Rabies vaccine. Will forward message to MD to advise on lab. [place]

## 2020-01-19 ENCOUNTER — Other Ambulatory Visit: Payer: Self-pay

## 2020-01-19 ENCOUNTER — Ambulatory Visit (INDEPENDENT_AMBULATORY_CARE_PROVIDER_SITE_OTHER): Payer: BC Managed Care – PPO | Admitting: Internal Medicine

## 2020-01-19 ENCOUNTER — Encounter (INDEPENDENT_AMBULATORY_CARE_PROVIDER_SITE_OTHER): Payer: Self-pay | Admitting: Internal Medicine

## 2020-01-19 VITALS — BP 140/90 | HR 90 | Temp 98.0°F | Resp 18 | Ht 63.0 in | Wt 188.0 lb

## 2020-01-19 DIAGNOSIS — R251 Tremor, unspecified: Secondary | ICD-10-CM

## 2020-01-19 DIAGNOSIS — F32A Depression, unspecified: Secondary | ICD-10-CM

## 2020-01-19 DIAGNOSIS — F329 Major depressive disorder, single episode, unspecified: Secondary | ICD-10-CM | POA: Diagnosis not present

## 2020-01-19 DIAGNOSIS — F419 Anxiety disorder, unspecified: Secondary | ICD-10-CM | POA: Diagnosis not present

## 2020-01-19 MED ORDER — ESCITALOPRAM OXALATE 5 MG PO TABS: 5.0000 mg | ORAL_TABLET | Freq: Every day | ORAL | 3 refills | Status: DC

## 2020-01-19 MED ORDER — CLONAZEPAM 0.5 MG PO TABS: 0.5000 mg | ORAL_TABLET | Freq: Every day | ORAL | 3 refills | Status: DC
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
## 2020-01-24 ENCOUNTER — Ambulatory Visit: Payer: BC Managed Care – PPO | Admitting: Neurology

## 2020-01-24 LAB — NEUROMYELITIS OPTICA AQP4 AUTO AB

## 2020-01-24 LAB — CMV ABS, IGG+IGM (CYTOMEGALOVIRUS)
CMV IgM: <30 AU/mL
Cytomegalovirus Ab-IgG: <0.6 U/mL

## 2020-01-24 LAB — VARICELLA-ZOSTER BY PCR: VARICELLA ZOSTER VIRUS (VZV) DNA, QL RT PCR: NOT DETECTED

## 2020-01-24 LAB — MYELIN OLIGODENDROCYTE GLYCOPROTEIN(MOG)ABW/RFLX: MOG AB CBAM Serum: NEGATIVE
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
## 2020-01-25 ENCOUNTER — Ambulatory Visit: Payer: BC Managed Care – PPO | Admitting: Neurology

## 2020-01-25 ENCOUNTER — Encounter: Payer: Self-pay | Admitting: Neurology

## 2020-01-25 ENCOUNTER — Other Ambulatory Visit: Payer: Self-pay

## 2020-01-25 VITALS — BP 133/90 | HR 87 | Temp 97.9°F | Ht 63.0 in | Wt 186.0 lb

## 2020-01-25 DIAGNOSIS — G932 Benign intracranial hypertension: Secondary | ICD-10-CM

## 2020-01-25 DIAGNOSIS — H4710 Unspecified papilledema: Secondary | ICD-10-CM

## 2020-01-25 DIAGNOSIS — R251 Tremor, unspecified: Secondary | ICD-10-CM | POA: Diagnosis not present

## 2020-01-25 MED ORDER — FUROSEMIDE 20 MG PO TABS: 20.0000 mg | ORAL_TABLET | ORAL | 3 refills | Status: DC

## 2020-01-25 NOTE — Patient Instructions (Addendum)
Referral back to [facility]  Furosemide Oral Tablets What is this medicine? FUROSEMIDE (fyoor [place] se mide) is a diuretic. It helps you make more urine and to lose salt and excess water from your body. It treats swelling from heart, kidney, or liver disease. It also treats high blood pressure. This medicine may be used for other purposes; ask your health care provider or pharmacist if you have questions. COMMON BRAND NAME(S): Active-Medicated Specimen Kit, Delone, Diuscreen, Lasix, RX Specimen Collection Kit, Specimen Collection Kit, URINX Medicated Specimen Collection What should I tell my health care provider before I take this medicine? They need to know if you have any of these conditions:  abnormal blood electrolytes  diarrhea or vomiting  gout  heart disease  kidney disease, small amounts of urine, or difficulty passing urine  liver disease  thyroid disease  an unusual or allergic reaction to furosemide, sulfa drugs, other medicines, foods, dyes, or preservatives  pregnant or trying to get pregnant  breast-feeding How should I use this medicine? Take this drug by mouth. Take it as directed on the prescription label at the same time every day. You can take it with or without food. If it upsets your stomach, take it with food. Keep taking it unless your health care provider tells you to stop. Talk to your health care provider about the use of this drug in children. Special care may be needed. Overdosage: If you think you have taken too much of this medicine contact a poison control center or emergency room at once. NOTE: This medicine is only for you. Do not share this medicine with others. What if I miss a dose? If you miss a dose, take it as soon as you can. If it is almost time for your next dose, take only that dose. Do not take double or extra doses. What may interact with this medicine?  aspirin and aspirin-like medicines  certain antibiotics  chloral  hydrate  cisplatin  cyclosporine  digoxin  diuretics  laxatives  lithium  medicines for blood pressure  medicines that relax muscles for surgery  methotrexate  NSAIDs, medicines for pain and inflammation like ibuprofen, naproxen, or indomethacin  phenytoin  steroid medicines like prednisone or cortisone  sucralfate  thyroid hormones This list may not describe all possible interactions. Give your health care provider a list of all the medicines, herbs, non-prescription drugs, or dietary supplements you use. Also tell them if you smoke, drink alcohol, or use illegal drugs. Some items may interact with your medicine. What should I watch for while using this medicine? Visit your doctor or health care provider for regular checks on your progress. Check your blood pressure regularly. Ask your doctor or health care provider what your blood pressure should be, and when you should contact him or her. If you are a diabetic, check your blood sugar as directed. This medicine may cause serious skin reactions. They can happen weeks to months after starting the medicine. Contact your health care provider right away if you notice fevers or flu-like symptoms with a rash. The rash may be red or purple and then turn into blisters or peeling of the skin. Or, you might notice a red rash with swelling of the face, lips or lymph nodes in your neck or under your arms. You may need to be on a special diet while taking this medicine. Check with your doctor. Also, ask how many glasses of fluid you need to drink a day. You must not get  dehydrated. You may get drowsy or dizzy. Do not drive, use machinery, or do anything that needs mental alertness until you know how this drug affects you. Do not stand or sit up quickly, especially if you are an older patient. This reduces the risk of dizzy or fainting spells. Alcohol can make you more drowsy and dizzy. Avoid alcoholic drinks. This medicine can make you more  sensitive to the sun. Keep out of the sun. If you cannot avoid being in the sun, wear protective clothing and use sunscreen. Do not use sun lamps or tanning beds/booths. What side effects may I notice from receiving this medicine? Side effects that you should report to your doctor or health care professional as soon as possible:  blood in urine or stools  dry mouth  fever or chills  hearing loss or ringing in the ears  irregular heartbeat  muscle pain or weakness, cramps  rash, fever, and swollen lymph nodes  redness, blistering, peeling or loosening of the skin, including inside the mouth  skin rash  stomach upset, pain, or nausea  tingling or numbness in the hands or feet  unusually weak or tired  vomiting or diarrhea  yellowing of the eyes or skin Side effects that usually do not require medical attention (report to your doctor or health care professional if they continue or are bothersome):  headache  loss of appetite  unusual bleeding or bruising This list may not describe all possible side effects. Call your doctor for medical advice about side effects. You may report side effects to FDA at 1-800-FDA-1088. Where should I keep my medicine? Keep out of the reach of children and pets. Store at room temperature between 20 and 25 degrees C (68 and 77 degrees F). Protect from light and moisture. Keep the container tightly closed. Throw away any unused drug after the expiration date. NOTE: This sheet is a summary. It may not cover all possible information. If you have questions about this medicine, talk to your doctor, pharmacist, or health care provider.  2020 Elsevier/Gold Standard (2019-07-27 18:01:32)

## 2020-01-26 ENCOUNTER — Encounter (INDEPENDENT_AMBULATORY_CARE_PROVIDER_SITE_OTHER): Payer: Self-pay | Admitting: Internal Medicine

## 2020-01-31 ENCOUNTER — Encounter (INDEPENDENT_AMBULATORY_CARE_PROVIDER_SITE_OTHER): Payer: Self-pay | Admitting: Internal Medicine

## 2020-01-31 DIAGNOSIS — R200 Anesthesia of skin: Secondary | ICD-10-CM | POA: Diagnosis not present

## 2020-01-31 DIAGNOSIS — R202 Paresthesia of skin: Secondary | ICD-10-CM | POA: Diagnosis not present

## 2020-01-31 DIAGNOSIS — R930 Abnormal findings on diagnostic imaging of skull and head, not elsewhere classified: Secondary | ICD-10-CM | POA: Diagnosis not present

## 2020-01-31 DIAGNOSIS — H538 Other visual disturbances: Secondary | ICD-10-CM | POA: Diagnosis not present

## 2020-01-31 DIAGNOSIS — M6281 Muscle weakness (generalized): Secondary | ICD-10-CM | POA: Diagnosis not present

## 2020-01-31 DIAGNOSIS — R531 Weakness: Secondary | ICD-10-CM | POA: Diagnosis not present

## 2020-01-31 DIAGNOSIS — G932 Benign intracranial hypertension: Secondary | ICD-10-CM | POA: Diagnosis not present

## 2020-01-31 DIAGNOSIS — R251 Tremor, unspecified: Secondary | ICD-10-CM | POA: Diagnosis not present

## 2020-01-31 DIAGNOSIS — H532 Diplopia: Secondary | ICD-10-CM | POA: Diagnosis not present

## 2020-02-02 DIAGNOSIS — R259 Unspecified abnormal involuntary movements: Secondary | ICD-10-CM | POA: Diagnosis not present

## 2020-02-02 DIAGNOSIS — G932 Benign intracranial hypertension: Secondary | ICD-10-CM | POA: Diagnosis not present

## 2020-02-02 DIAGNOSIS — R251 Tremor, unspecified: Secondary | ICD-10-CM | POA: Diagnosis not present

## 2020-02-07 DIAGNOSIS — F419 Anxiety disorder, unspecified: Secondary | ICD-10-CM | POA: Diagnosis not present

## 2020-02-07 DIAGNOSIS — R251 Tremor, unspecified: Secondary | ICD-10-CM | POA: Diagnosis not present

## 2020-02-07 DIAGNOSIS — R531 Weakness: Secondary | ICD-10-CM | POA: Diagnosis not present

## 2020-02-07 DIAGNOSIS — R5383 Other fatigue: Secondary | ICD-10-CM | POA: Diagnosis not present

## 2020-02-07 DIAGNOSIS — Z6833 Body mass index (BMI) 33.0-33.9, adult: Secondary | ICD-10-CM | POA: Diagnosis not present

## 2020-02-11 ENCOUNTER — Encounter (INDEPENDENT_AMBULATORY_CARE_PROVIDER_SITE_OTHER): Payer: Self-pay | Admitting: Internal Medicine

## 2020-02-13 DIAGNOSIS — R251 Tremor, unspecified: Secondary | ICD-10-CM | POA: Diagnosis not present

## 2020-02-13 DIAGNOSIS — N390 Urinary tract infection, site not specified: Secondary | ICD-10-CM | POA: Diagnosis not present

## 2020-02-13 DIAGNOSIS — Z711 Person with feared health complaint in whom no diagnosis is made: Secondary | ICD-10-CM | POA: Diagnosis not present

## 2020-02-13 DIAGNOSIS — Z888 Allergy status to other drugs, medicaments and biological substances status: Secondary | ICD-10-CM | POA: Diagnosis not present

## 2020-02-13 DIAGNOSIS — Z882 Allergy status to sulfonamides status: Secondary | ICD-10-CM | POA: Diagnosis not present

## 2020-02-13 DIAGNOSIS — R319 Hematuria, unspecified: Secondary | ICD-10-CM | POA: Diagnosis not present

## 2020-02-13 DIAGNOSIS — M6281 Muscle weakness (generalized): Secondary | ICD-10-CM | POA: Diagnosis not present

## 2020-02-13 DIAGNOSIS — G932 Benign intracranial hypertension: Secondary | ICD-10-CM | POA: Diagnosis not present

## 2020-02-13 DIAGNOSIS — G252 Other specified forms of tremor: Secondary | ICD-10-CM | POA: Diagnosis not present

## 2020-02-13 DIAGNOSIS — H539 Unspecified visual disturbance: Secondary | ICD-10-CM | POA: Diagnosis not present

## 2020-02-13 DIAGNOSIS — M791 Myalgia, unspecified site: Secondary | ICD-10-CM | POA: Diagnosis not present

## 2020-02-13 DIAGNOSIS — F449 Dissociative and conversion disorder, unspecified: Secondary | ICD-10-CM | POA: Diagnosis not present

## 2020-02-14 ENCOUNTER — Other Ambulatory Visit (INDEPENDENT_AMBULATORY_CARE_PROVIDER_SITE_OTHER): Payer: Self-pay

## 2020-02-14 DIAGNOSIS — R251 Tremor, unspecified: Secondary | ICD-10-CM

## 2020-02-14 NOTE — Telephone Encounter (Signed)
Would you like me to start the process on freferrals?

## 2020-02-14 NOTE — Progress Notes (Signed)
Pt needed new referral for the following per Neurologist recommendations Dr. Sarina Ill ,Dr .London Pepper ,NP-C ask to send referrals over to the following listed.  1. Neuro Physiatrist, 2. Immunologist. Pt was seen.  Pt also has a appointment setup per Dr Jenny Reichmann to see  Trixie Rude, MD @ [facility] Neurology.

## 2020-02-14 NOTE — Telephone Encounter (Signed)
Sorry he is not our patient.

## 2020-02-14 NOTE — Telephone Encounter (Signed)
Will send now

## 2020-02-16 DIAGNOSIS — G932 Benign intracranial hypertension: Secondary | ICD-10-CM | POA: Diagnosis not present

## 2020-02-18 DIAGNOSIS — H538 Other visual disturbances: Secondary | ICD-10-CM | POA: Diagnosis not present

## 2020-02-18 DIAGNOSIS — R259 Unspecified abnormal involuntary movements: Secondary | ICD-10-CM | POA: Diagnosis not present

## 2020-02-18 DIAGNOSIS — R29818 Other symptoms and signs involving the nervous system: Secondary | ICD-10-CM | POA: Diagnosis not present

## 2020-02-18 DIAGNOSIS — Z882 Allergy status to sulfonamides status: Secondary | ICD-10-CM | POA: Diagnosis not present

## 2020-02-21 DIAGNOSIS — G932 Benign intracranial hypertension: Secondary | ICD-10-CM | POA: Diagnosis not present

## 2020-02-21 DIAGNOSIS — R2689 Other abnormalities of gait and mobility: Secondary | ICD-10-CM | POA: Diagnosis not present

## 2020-02-21 DIAGNOSIS — J343 Hypertrophy of nasal turbinates: Secondary | ICD-10-CM | POA: Diagnosis not present

## 2020-02-21 DIAGNOSIS — R42 Dizziness and giddiness: Secondary | ICD-10-CM | POA: Diagnosis not present

## 2020-02-25 ENCOUNTER — Other Ambulatory Visit: Payer: Self-pay

## 2020-02-28 ENCOUNTER — Encounter: Payer: Self-pay | Admitting: Family Medicine

## 2020-02-28 ENCOUNTER — Other Ambulatory Visit: Payer: Self-pay

## 2020-02-28 ENCOUNTER — Ambulatory Visit: Payer: BC Managed Care – PPO | Admitting: Family Medicine

## 2020-02-28 VITALS — BP 116/71 | HR 90 | Temp 99.1°F | Ht 63.0 in | Wt 179.1 lb

## 2020-02-28 DIAGNOSIS — R509 Fever, unspecified: Secondary | ICD-10-CM | POA: Diagnosis not present

## 2020-02-28 DIAGNOSIS — R251 Tremor, unspecified: Secondary | ICD-10-CM | POA: Diagnosis not present

## 2020-02-28 DIAGNOSIS — M62838 Other muscle spasm: Secondary | ICD-10-CM | POA: Diagnosis not present

## 2020-02-28 MED ORDER — PREDNISONE 20 MG PO TABS: ORAL_TABLET | ORAL | 0 refills | Status: DC
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
## 2020-03-06 LAB — LYME AB/WESTERN BLOT REFLEX
LYME DISEASE AB, QUANT, IGM: <0.8 index (ref 0.00–0.79)
Lyme IgG/IgM Ab: <0.91 {ISR} (ref 0.00–0.90)

## 2020-03-06 LAB — CMP14+EGFR
ALT: 15 IU/L (ref 0–32)
AST: 20 IU/L (ref 0–40)
Albumin/Globulin Ratio: 1.8 (ref 1.2–2.2)
Albumin: 4.2 g/dL (ref 3.8–4.8)
Alkaline Phosphatase: 118 IU/L — ABNORMAL HIGH (ref 39–117)
BUN/Creatinine Ratio: 13 (ref 9–23)
BUN: 9 mg/dL (ref 6–24)
Bilirubin Total: <0.2 mg/dL (ref 0.0–1.2)
CO2: 19 mmol/L — ABNORMAL LOW (ref 20–29)
Calcium: 8.9 mg/dL (ref 8.7–10.2)
Chloride: 108 mmol/L — ABNORMAL HIGH (ref 96–106)
Creatinine, Ser: 0.69 mg/dL (ref 0.57–1.00)
GFR calc Af Amer: 124 mL/min/{1.73_m2} (ref >59)
GFR calc non Af Amer: 108 mL/min/{1.73_m2} (ref >59)
Globulin, Total: 2.4 g/dL (ref 1.5–4.5)
Glucose: 82 mg/dL (ref 65–99)
Potassium: 4.3 mmol/L (ref 3.5–5.2)
Sodium: 141 mmol/L (ref 134–144)
Total Protein: 6.6 g/dL (ref 6.0–8.5)

## 2020-03-06 LAB — CBC WITH DIFFERENTIAL/PLATELET
Basophils Absolute: 0 10*3/uL (ref 0.0–0.2)
Basos: 0 %
EOS (ABSOLUTE): 0.2 10*3/uL (ref 0.0–0.4)
Eos: 3 %
Hematocrit: 34.3 % (ref 34.0–46.6)
Hemoglobin: 11.2 g/dL (ref 11.1–15.9)
Immature Grans (Abs): 0 10*3/uL (ref 0.0–0.1)
Immature Granulocytes: 0 %
Lymphocytes Absolute: 1.4 10*3/uL (ref 0.7–3.1)
Lymphs: 16 %
MCH: 26.5 pg — ABNORMAL LOW (ref 26.6–33.0)
MCHC: 32.7 g/dL (ref 31.5–35.7)
MCV: 81 fL (ref 79–97)
Monocytes Absolute: 0.9 10*3/uL (ref 0.1–0.9)
Monocytes: 10 %
Neutrophils Absolute: 6.4 10*3/uL (ref 1.4–7.0)
Neutrophils: 71 %
Platelets: 407 10*3/uL (ref 150–450)
RBC: 4.22 x10E6/uL (ref 3.77–5.28)
RDW: 14.2 % (ref 11.7–15.4)
WBC: 9 10*3/uL (ref 3.4–10.8)

## 2020-03-06 LAB — AUTOIMMUNE PROFILE
Anti Nuclear Antibody (ANA): NEGATIVE
Complement C3, Serum: 151 mg/dL (ref 82–167)
dsDNA Ab: 1 IU/mL (ref 0–9)

## 2020-03-06 LAB — EPSTEIN-BARR VIRUS (EBV) ANTIBODY PROFILE
EBV NA IgG: 232 U/mL — ABNORMAL HIGH (ref 0.0–17.9)
EBV VCA IgG: 37.4 U/mL — ABNORMAL HIGH (ref 0.0–17.9)
EBV VCA IgM: <36 U/mL (ref 0.0–35.9)

## 2020-03-06 LAB — CMV DNA, QUANTITATIVE, PCR: CMV DNA Quant: NEGATIVE IU/mL

## 2020-03-06 LAB — HLA-B27 ANTIGEN: HLA B27: NEGATIVE

## 2020-03-06 LAB — CYCLIC CITRUL PEPTIDE ANTIBODY, IGG/IGA: Cyclic Citrullin Peptide Ab: 7 units (ref 0–19)

## 2020-03-06 LAB — HIV ANTIBODY (ROUTINE TESTING W REFLEX): HIV Screen 4th Generation wRfx: NONREACTIVE

## 2020-03-06 LAB — ROCKY MTN SPOTTED FVR ABS PNL(IGG+IGM)
RMSF IgG: NEGATIVE
RMSF IgM: 0.72 index (ref 0.00–0.89)

## 2020-03-06 LAB — HEPATITIS C ANTIBODY: Hep C Virus Ab: <0.1 s/co ratio (ref 0.0–0.9)

## 2020-03-06 LAB — RPR: RPR Ser Ql: NONREACTIVE

## 2020-03-06 LAB — SEDIMENTATION RATE: Sed Rate: 19 mm/hr (ref 0–32)

## 2020-03-06 LAB — HEPATITIS B SURFACE ANTIGEN: Hepatitis B Surface Ag: NEGATIVE

## 2020-03-07 ENCOUNTER — Telehealth: Payer: Self-pay | Admitting: *Deleted

## 2020-03-07 ENCOUNTER — Ambulatory Visit (INDEPENDENT_AMBULATORY_CARE_PROVIDER_SITE_OTHER): Payer: BC Managed Care – PPO | Admitting: Nurse Practitioner

## 2020-03-07 NOTE — Telephone Encounter (Signed)
Patient went to primary care as she is still experiencing the same symptoms she saw Dr Baxter Flattery for in January.  PCP drew additional labs.  Patient would like Dr Storm Frisk input on the chronic mono, stating that the steroid pack she was prescribed did not help her. She would like to know other treatment options for chronic mono and how contagious she is. Please advise. Landis Gandy, RN

## 2020-03-08 ENCOUNTER — Telehealth: Payer: Self-pay | Admitting: Family Medicine

## 2020-03-08 NOTE — Telephone Encounter (Signed)
  REFERRAL REQUEST Telephone Note 03/08/2020  What type of referral do you need? Infectious Disease @ [facility]  Have you been seen at our office for this problem? Yes - Tumor discussed with Dr. Livia Snellen. (Advise that they may need an appointment with their PCP before a referral can be done)  Is there a particular doctor or location that you prefer? [facility] Patient notified that referrals can take up to a week or longer to process. If they haven't heard anything within a week they should call back and speak with the referral department.

## 2020-03-09 ENCOUNTER — Other Ambulatory Visit: Payer: Self-pay | Admitting: Family Medicine

## 2020-03-09 DIAGNOSIS — R251 Tremor, unspecified: Secondary | ICD-10-CM

## 2020-03-09 DIAGNOSIS — R509 Fever, unspecified: Secondary | ICD-10-CM

## 2020-03-09 DIAGNOSIS — Z209 Contact with and (suspected) exposure to unspecified communicable disease: Secondary | ICD-10-CM

## 2020-03-10 DIAGNOSIS — G932 Benign intracranial hypertension: Secondary | ICD-10-CM | POA: Diagnosis not present

## 2020-03-12 ENCOUNTER — Encounter: Payer: Self-pay | Admitting: Family Medicine

## 2020-03-16 ENCOUNTER — Telehealth: Payer: Self-pay | Admitting: Family Medicine

## 2020-03-16 NOTE — Telephone Encounter (Signed)
LMYCB

## 2020-03-20 ENCOUNTER — Telehealth: Payer: Self-pay

## 2020-03-20 ENCOUNTER — Other Ambulatory Visit: Payer: Self-pay | Admitting: Family Medicine

## 2020-03-20 MED ORDER — VALACYCLOVIR HCL 1 G PO TABS: 1000.0000 mg | ORAL_TABLET | Freq: Every day | ORAL | 2 refills | Status: DC

## 2020-03-20 NOTE — Telephone Encounter (Signed)
She will have to be seen for treatment. How was mono infection confirmed?

## 2020-03-20 NOTE — Addendum Note (Signed)
Addended by: Wardell Heath on: 03/20/2020 02:26 PM   Modules accepted: Orders

## 2020-03-20 NOTE — Telephone Encounter (Signed)
Ok to send

## 2020-03-20 NOTE — Telephone Encounter (Signed)
Not on med list. Was given this by Dr. Arbie Cookey at [place] back in November, has one tablet left

## 2020-03-20 NOTE — Telephone Encounter (Signed)
Received a call from [facility] with [facility] for cognitive behavior therapy.  States that patient is very anxious about her new dx of chronic mono.  States that patient has been looking up stuff on the Internet and is worried that she will only live if she gets a stem cell transplant.  Patient has apt with Dr. Artemio Aly 4/8 at 3:55pm and wants to discuss it during the visit.  Jennifer aware Dr. Artemio Aly is out of the office and will return next week. States it is fine to wait until the appointment but just wanted him to know to discuss it in the visit.

## 2020-03-20 NOTE — Telephone Encounter (Signed)
Medication sent.  Patient aware

## 2020-03-20 NOTE — Telephone Encounter (Signed)
  REFERRAL REQUEST Telephone Note 03/20/2020  What type of referral do you need? Pt has mono and wants to be referred to an Immunlogist  Have you been seen at our office for this problem? Yes / 3 weeks ago with [facility] (Advise that they may need an appointment with their PCP before a referral can be done)  Is there a particular doctor or location that you prefer? [facility]  Patient notified that referrals can take up to a week or longer to process. If they haven't heard anything within a week they should call back and speak with the referral department.

## 2020-03-24 DIAGNOSIS — G932 Benign intracranial hypertension: Secondary | ICD-10-CM | POA: Diagnosis not present

## 2020-03-24 DIAGNOSIS — R29898 Other symptoms and signs involving the musculoskeletal system: Secondary | ICD-10-CM | POA: Diagnosis not present

## 2020-03-24 DIAGNOSIS — R509 Fever, unspecified: Secondary | ICD-10-CM | POA: Diagnosis not present

## 2020-03-24 DIAGNOSIS — R251 Tremor, unspecified: Secondary | ICD-10-CM | POA: Diagnosis not present

## 2020-03-30 ENCOUNTER — Other Ambulatory Visit: Payer: Self-pay

## 2020-03-30 ENCOUNTER — Ambulatory Visit: Payer: BC Managed Care – PPO | Admitting: Family Medicine

## 2020-03-30 ENCOUNTER — Encounter: Payer: Self-pay | Admitting: Family Medicine

## 2020-03-30 VITALS — BP 130/70 | HR 103 | Temp 99.3°F | Ht 63.0 in | Wt 173.2 lb

## 2020-03-30 DIAGNOSIS — B2701 Gammaherpesviral mononucleosis with polyneuropathy: Secondary | ICD-10-CM

## 2020-03-30 DIAGNOSIS — R251 Tremor, unspecified: Secondary | ICD-10-CM

## 2020-03-30 DIAGNOSIS — G3281 Cerebellar ataxia in diseases classified elsewhere: Secondary | ICD-10-CM

## 2020-03-30 MED ORDER — PREDNISONE 20 MG PO TABS: 40.0000 mg | ORAL_TABLET | Freq: Every day | ORAL | 0 refills | Status: DC
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
## 2020-03-31 ENCOUNTER — Other Ambulatory Visit: Payer: Self-pay | Admitting: *Deleted

## 2020-03-31 ENCOUNTER — Other Ambulatory Visit: Payer: BC Managed Care – PPO

## 2020-03-31 DIAGNOSIS — B2701 Gammaherpesviral mononucleosis with polyneuropathy: Secondary | ICD-10-CM

## 2020-03-31 LAB — CMP14+EGFR
ALT: 26 IU/L (ref 0–32)
AST: 15 IU/L (ref 0–40)
Albumin/Globulin Ratio: 1.6 (ref 1.2–2.2)
Albumin: 4.1 g/dL (ref 3.8–4.8)
Alkaline Phosphatase: 79 IU/L (ref 39–117)
BUN/Creatinine Ratio: 9 (ref 9–23)
BUN: 8 mg/dL (ref 6–24)
Bilirubin Total: 0.3 mg/dL (ref 0.0–1.2)
CO2: 23 mmol/L (ref 20–29)
Calcium: 9.3 mg/dL (ref 8.7–10.2)
Chloride: 106 mmol/L (ref 96–106)
Creatinine, Ser: 0.9 mg/dL (ref 0.57–1.00)
GFR calc Af Amer: 91 mL/min/{1.73_m2} (ref >59)
GFR calc non Af Amer: 79 mL/min/{1.73_m2} (ref >59)
Globulin, Total: 2.5 g/dL (ref 1.5–4.5)
Glucose: 97 mg/dL (ref 65–99)
Potassium: 4 mmol/L (ref 3.5–5.2)
Sodium: 144 mmol/L (ref 134–144)
Total Protein: 6.6 g/dL (ref 6.0–8.5)

## 2020-03-31 LAB — EPSTEIN BARR VRS(EBV DNA BY PCR)

## 2020-03-31 LAB — T + B-LYMPHOCYTE DIFFERENTIAL
% CD 3 Pos. Lymph.: 63 % (ref 57.5–86.2)
% CD 4 Pos. Lymph.: 53.3 % (ref 30.8–58.5)
Absolute CD 3: 945 /uL (ref 622–2402)
Absolute CD 4 Helper: 800 /uL (ref 359–1519)
Basophils Absolute: 0 10*3/uL (ref 0.0–0.2)
Basos: 0 %
CD19 % B Cell: 26 % — ABNORMAL HIGH (ref 3.3–25.4)
CD19 Abs: 390 /uL (ref 12–645)
CD4/CD8 Ratio: 5.33 — ABNORMAL HIGH (ref 0.92–3.72)
CD8 % Suppressor T Cell: 10 % — ABNORMAL LOW (ref 12.0–35.5)
CD8 T Cell Abs: 150 /uL (ref 109–897)
EOS (ABSOLUTE): 0.4 10*3/uL (ref 0.0–0.4)
Eos: 4 %
Hematocrit: 37.3 % (ref 34.0–46.6)
Hemoglobin: 12 g/dL (ref 11.1–15.9)
Immature Grans (Abs): 0.1 10*3/uL (ref 0.0–0.1)
Immature Granulocytes: 1 %
Lymphocytes Absolute: 1.5 10*3/uL (ref 0.7–3.1)
Lymphs: 16 %
MCH: 26.5 pg — ABNORMAL LOW (ref 26.6–33.0)
MCHC: 32.2 g/dL (ref 31.5–35.7)
MCV: 83 fL (ref 79–97)
Monocytes Absolute: 0.9 10*3/uL (ref 0.1–0.9)
Monocytes: 10 %
Neutrophils Absolute: 6.4 10*3/uL (ref 1.4–7.0)
Neutrophils: 69 %
Platelets: 437 10*3/uL (ref 150–450)
RBC: 4.52 x10E6/uL (ref 3.77–5.28)
RDW: 14.9 % (ref 11.7–15.4)
WBC: 9.2 10*3/uL (ref 3.4–10.8)

## 2020-03-31 LAB — SPECIMEN STATUS REPORT

## 2020-04-02 ENCOUNTER — Encounter: Payer: Self-pay | Admitting: Family Medicine

## 2020-04-04 LAB — EPSTEIN BARR VRS(EBV DNA BY PCR): Epstein-Barr DNA Quant, PCR: NEGATIVE copies/mL

## 2020-04-05 DIAGNOSIS — R251 Tremor, unspecified: Secondary | ICD-10-CM | POA: Diagnosis not present

## 2020-04-05 DIAGNOSIS — R531 Weakness: Secondary | ICD-10-CM | POA: Diagnosis not present

## 2020-04-18 DIAGNOSIS — R61 Generalized hyperhidrosis: Secondary | ICD-10-CM | POA: Diagnosis not present

## 2020-04-18 DIAGNOSIS — R251 Tremor, unspecified: Secondary | ICD-10-CM | POA: Diagnosis not present

## 2020-04-18 DIAGNOSIS — R634 Abnormal weight loss: Secondary | ICD-10-CM | POA: Diagnosis not present

## 2020-04-27 ENCOUNTER — Other Ambulatory Visit: Payer: Self-pay

## 2020-04-27 ENCOUNTER — Encounter: Payer: Self-pay | Admitting: Family Medicine

## 2020-04-27 ENCOUNTER — Ambulatory Visit: Payer: BC Managed Care – PPO | Admitting: Family Medicine

## 2020-04-27 VITALS — BP 137/88 | HR 86 | Temp 98.0°F | Resp 18 | Ht 63.0 in | Wt 171.4 lb

## 2020-04-27 DIAGNOSIS — G3281 Cerebellar ataxia in diseases classified elsewhere: Secondary | ICD-10-CM | POA: Diagnosis not present

## 2020-04-27 DIAGNOSIS — R509 Fever, unspecified: Secondary | ICD-10-CM | POA: Diagnosis not present

## 2020-04-27 DIAGNOSIS — B2701 Gammaherpesviral mononucleosis with polyneuropathy: Secondary | ICD-10-CM

## 2020-04-27 DIAGNOSIS — R251 Tremor, unspecified: Secondary | ICD-10-CM | POA: Diagnosis not present

## 2020-04-27 DIAGNOSIS — M62838 Other muscle spasm: Secondary | ICD-10-CM

## 2020-04-27 NOTE — Progress Notes (Signed)
Subjective:  Patient ID: Vanessa Harrington, female    DOB: [birth year]  Age: 44 y.o. MRN: [id number]  CC: Follow-up (1 month)   HPI Vanessa Harrington presents for continued tremors and fatigue.  Symptoms have improved only minimally since her last visit here.  She was been seeing neurology at [facility] in the meantime.  Those notes were reviewed.  A LP has been planned for tomorrow.  She hopes that they will do some new testing rather than just repeating what was done the last time.  She is skeptical about the diagnosis of chronic mono since the confirmation test was negative.  However she is willing to entertain the fact that she is recuperating from a mono from last fall.  This is because her titers are rather high for the IgG even though negative for the IgM early antigens etc.  Additionally she is having fever of unknown origin continue daily 100 to 101 degrees.  She is just generally feeling tired and miserable.  Depression screen [facility] 2/9 03/30/2020 02/28/2020 12/29/2019  Decreased Interest 1 3 0  Down, Depressed, Hopeless 1 3 0  PHQ - 2 Score 2 6 0  Altered sleeping 3 0 -  Tired, decreased energy 1 2 -  Change in appetite 1 1 -  Feeling bad or failure about yourself  1 0 -  Trouble concentrating 1 1 -  Moving slowly or fidgety/restless 0 0 -  Suicidal thoughts 0 0 -  PHQ-9 Score 9 10 -  Difficult doing work/chores Somewhat difficult Very difficult -    History [name] has a past medical history of Anxiety, Blood pressure alteration, Depression, Exposure to bat without known bite (10/30/2019), Hypertension, Increased intracranial pressure, and Pseudotumor cerebri.   She has a past surgical history that includes Cholecystectomy (2015); Cesarean section (1999); Mouth surgery (2005); and Tubal ligation (2001).   Her family history includes Heart Problems in her brother and father; Heart disease in her brother, brother, and father; Hypothyroidism in her mother.She reports that she quit  smoking about 13 years ago. Her smoking use included cigarettes. She has never used smokeless tobacco. She reports that she does not drink alcohol or use drugs.    ROS Review of Systems  Constitutional: Positive for activity change, chills, diaphoresis, fatigue and fever.  HENT: Negative for congestion.   Eyes: Negative for visual disturbance.  Respiratory: Negative for shortness of breath.   Cardiovascular: Negative for chest pain.  Gastrointestinal: Negative for abdominal pain, constipation, diarrhea, nausea and vomiting.  Genitourinary: Negative for difficulty urinating.  Musculoskeletal: Positive for arthralgias and myalgias.  Neurological: Positive for headaches.  Psychiatric/Behavioral: Positive for sleep disturbance.    Objective:  BP 137/88   Pulse 86   Temp 98 F (36.7 C) (Temporal)   Resp 18   Ht 5' 3"  (1.6 m)   Wt 171 lb 6.4 oz (77.7 kg)   SpO2 97%   BMI 30.36 kg/m   BP Readings from Last 3 Encounters:  04/27/20 137/88  03/30/20 130/70  02/28/20 116/71    Wt Readings from Last 3 Encounters:  04/27/20 171 lb 6.4 oz (77.7 kg)  03/30/20 173 lb 3.2 oz (78.6 kg)  02/28/20 179 lb 2 oz (81.3 kg)     Physical Exam Constitutional:      General: She is not in acute distress.    Appearance: She is well-developed.  HENT:     Head: Normocephalic and atraumatic.  Eyes:     Conjunctiva/sclera: Conjunctivae normal.  Pupils: Pupils are equal, round, and reactive to light.  Neck:     Thyroid: No thyromegaly.  Cardiovascular:     Rate and Rhythm: Normal rate and regular rhythm.     Heart sounds: Normal heart sounds. No murmur.  Pulmonary:     Effort: Pulmonary effort is normal. No respiratory distress.     Breath sounds: Normal breath sounds. No wheezing or rales.  Abdominal:     General: There is no distension.     Palpations: Abdomen is soft.     Tenderness: There is no abdominal tenderness.  Musculoskeletal:        General: Normal range of motion.      Cervical back: Normal range of motion and neck supple.  Lymphadenopathy:     Cervical: No cervical adenopathy.  Skin:    General: Skin is warm and dry.  Neurological:     Mental Status: She is alert and oriented to person, place, and time.  Psychiatric:        Behavior: Behavior normal.        Thought Content: Thought content normal.        Judgment: Judgment normal.       Assessment & Plan:   Vanessa Harrington was seen today for follow-up.  Diagnoses and all orders for this visit:  Gammaherpesviral mononucleosis with polyneuropathy -     Upper Respiratory Culture, Routine -     CBC with Differential/Platelet -     CMP14+EGFR -     Arthritis Panel -     CYCLIC CITRUL PEPTIDE ANTIBODY, IGG/IGA -     Anti-DNA antibody, double-stranded -     HLA-B27 antigen -     Sedimentation rate -     Lyme Ab/Western Blot Reflex -     Rocky mtn spotted fvr abs pnl(IgG+IgM) -     Vitamin B12 -     Hepatitis C antibody -     Epstein-Barr virus VCA, IgG -     Epstein-Barr virus VCA, IgM -     Epstein-Barr virus nuclear antigen antibody, IgG -     Epstein-Barr virus early D antigen antibody, IgG  Fever, unspecified fever cause -     Upper Respiratory Culture, Routine -     CBC with Differential/Platelet -     CMP14+EGFR -     Arthritis Panel -     CYCLIC CITRUL PEPTIDE ANTIBODY, IGG/IGA -     Anti-DNA antibody, double-stranded -     HLA-B27 antigen -     Sedimentation rate -     Lyme Ab/Western Blot Reflex -     Rocky mtn spotted fvr abs pnl(IgG+IgM) -     Vitamin B12 -     Hepatitis C antibody -     Epstein-Barr virus VCA, IgG -     Epstein-Barr virus VCA, IgM -     Epstein-Barr virus nuclear antigen antibody, IgG -     Epstein-Barr virus early D antigen antibody, IgG  Tremor of unknown origin -     Upper Respiratory Culture, Routine -     CBC with Differential/Platelet -     CMP14+EGFR -     Arthritis Panel -     CYCLIC CITRUL PEPTIDE ANTIBODY, IGG/IGA -     Anti-DNA antibody,  double-stranded -     HLA-B27 antigen -     Sedimentation rate -     Lyme Ab/Western Blot Reflex -     Rocky mtn spotted fvr abs pnl(IgG+IgM) -  Vitamin B12 -     Hepatitis C antibody -     Epstein-Barr virus VCA, IgG -     Epstein-Barr virus VCA, IgM -     Epstein-Barr virus nuclear antigen antibody, IgG -     Epstein-Barr virus early D antigen antibody, IgG  Tremor of face and hands -     Upper Respiratory Culture, Routine -     CBC with Differential/Platelet -     CMP14+EGFR -     Arthritis Panel -     CYCLIC CITRUL PEPTIDE ANTIBODY, IGG/IGA -     Anti-DNA antibody, double-stranded -     HLA-B27 antigen -     Sedimentation rate -     Lyme Ab/Western Blot Reflex -     Rocky mtn spotted fvr abs pnl(IgG+IgM) -     Vitamin B12 -     Hepatitis C antibody -     Epstein-Barr virus VCA, IgG -     Epstein-Barr virus VCA, IgM -     Epstein-Barr virus nuclear antigen antibody, IgG -     Epstein-Barr virus early D antigen antibody, IgG  Cerebellar ataxia in diseases classified elsewhere ([place]) -     Upper Respiratory Culture, Routine -     CBC with Differential/Platelet -     CMP14+EGFR -     Arthritis Panel -     CYCLIC CITRUL PEPTIDE ANTIBODY, IGG/IGA -     Anti-DNA antibody, double-stranded -     HLA-B27 antigen -     Sedimentation rate -     Lyme Ab/Western Blot Reflex -     Rocky mtn spotted fvr abs pnl(IgG+IgM) -     Vitamin B12 -     Hepatitis C antibody -     Epstein-Barr virus VCA, IgG -     Epstein-Barr virus VCA, IgM -     Epstein-Barr virus nuclear antigen antibody, IgG -     Epstein-Barr virus early D antigen antibody, IgG  Muscle spasm -     Upper Respiratory Culture, Routine -     CBC with Differential/Platelet -     CMP14+EGFR -     Arthritis Panel -     CYCLIC CITRUL PEPTIDE ANTIBODY, IGG/IGA -     Anti-DNA antibody, double-stranded -     HLA-B27 antigen -     Sedimentation rate -     Lyme Ab/Western Blot Reflex -     Rocky mtn spotted fvr abs  pnl(IgG+IgM) -     Vitamin B12 -     Hepatitis C antibody -     Epstein-Barr virus VCA, IgG -     Epstein-Barr virus VCA, IgM -     Epstein-Barr virus nuclear antigen antibody, IgG -     Epstein-Barr virus early D antigen antibody, IgG       I am having Vanessa Harrington maintain her escitalopram, clonazePAM, valACYclovir, and predniSONE.  Allergies as of 04/27/2020      Reactions   Diamox [acetazolamide] Rash   Due to sulfa allergy   Sulfa Antibiotics Rash      Medication List       Accurate as of Apr 27, 2020 11:59 PM. If you have any questions, ask your nurse or doctor.        clonazePAM 0.5 MG tablet Commonly known as: KLONOPIN Take 1 tablet (0.5 mg total) by mouth at bedtime.   escitalopram 5 MG tablet Commonly known as: Lexapro Take 1 tablet (5 mg  total) by mouth daily.   predniSONE 20 MG tablet Commonly known as: DELTASONE Take 2 tablets (40 mg total) by mouth daily with breakfast.   valACYclovir 1000 MG tablet Commonly known as: VALTREX Take 1 tablet (1,000 mg total) by mouth daily.        Follow-up: Return in about 1 month (around 05/28/2020).  Claretta Fraise, M.D.

## 2020-04-28 DIAGNOSIS — G3281 Cerebellar ataxia in diseases classified elsewhere: Secondary | ICD-10-CM | POA: Diagnosis not present

## 2020-04-28 DIAGNOSIS — B2701 Gammaherpesviral mononucleosis with polyneuropathy: Secondary | ICD-10-CM | POA: Diagnosis not present

## 2020-04-28 DIAGNOSIS — R251 Tremor, unspecified: Secondary | ICD-10-CM | POA: Diagnosis not present

## 2020-04-28 DIAGNOSIS — R509 Fever, unspecified: Secondary | ICD-10-CM | POA: Diagnosis not present

## 2020-04-28 DIAGNOSIS — R634 Abnormal weight loss: Secondary | ICD-10-CM | POA: Diagnosis not present

## 2020-04-28 DIAGNOSIS — R61 Generalized hyperhidrosis: Secondary | ICD-10-CM | POA: Diagnosis not present

## 2020-04-28 DIAGNOSIS — G932 Benign intracranial hypertension: Secondary | ICD-10-CM | POA: Diagnosis not present

## 2020-04-29 DIAGNOSIS — G932 Benign intracranial hypertension: Secondary | ICD-10-CM | POA: Diagnosis not present

## 2020-04-29 DIAGNOSIS — R251 Tremor, unspecified: Secondary | ICD-10-CM | POA: Diagnosis not present

## 2020-04-29 DIAGNOSIS — G9608 Other cranial cerebrospinal fluid leak: Secondary | ICD-10-CM | POA: Diagnosis not present

## 2020-04-29 DIAGNOSIS — M5030 Other cervical disc degeneration, unspecified cervical region: Secondary | ICD-10-CM | POA: Diagnosis not present

## 2020-04-30 LAB — UPPER RESPIRATORY CULTURE, ROUTINE

## 2020-05-01 ENCOUNTER — Encounter: Payer: Self-pay | Admitting: Family Medicine

## 2020-05-04 LAB — CMP14+EGFR
ALT: 21 IU/L (ref 0–32)
AST: 21 IU/L (ref 0–40)
Albumin/Globulin Ratio: 1.7 (ref 1.2–2.2)
Albumin: 4.2 g/dL (ref 3.8–4.8)
Alkaline Phosphatase: 112 IU/L (ref 39–117)
BUN/Creatinine Ratio: 13 (ref 9–23)
BUN: 9 mg/dL (ref 6–24)
Bilirubin Total: <0.2 mg/dL (ref 0.0–1.2)
CO2: 21 mmol/L (ref 20–29)
Calcium: 9.5 mg/dL (ref 8.7–10.2)
Chloride: 106 mmol/L (ref 96–106)
Creatinine, Ser: 0.7 mg/dL (ref 0.57–1.00)
GFR calc Af Amer: 124 mL/min/{1.73_m2} (ref >59)
GFR calc non Af Amer: 107 mL/min/{1.73_m2} (ref >59)
Globulin, Total: 2.5 g/dL (ref 1.5–4.5)
Glucose: 88 mg/dL (ref 65–99)
Potassium: 4.8 mmol/L (ref 3.5–5.2)
Sodium: 142 mmol/L (ref 134–144)
Total Protein: 6.7 g/dL (ref 6.0–8.5)

## 2020-05-04 LAB — ARTHRITIS PANEL
Basophils Absolute: 0 10*3/uL (ref 0.0–0.2)
Basos: 0 %
EOS (ABSOLUTE): 0.3 10*3/uL (ref 0.0–0.4)
Eos: 3 %
Hematocrit: 35.2 % (ref 34.0–46.6)
Hemoglobin: 11.2 g/dL (ref 11.1–15.9)
Immature Grans (Abs): 0 10*3/uL (ref 0.0–0.1)
Immature Granulocytes: 0 %
Lymphocytes Absolute: 1.5 10*3/uL (ref 0.7–3.1)
Lymphs: 16 %
MCH: 26.1 pg — ABNORMAL LOW (ref 26.6–33.0)
MCHC: 31.8 g/dL (ref 31.5–35.7)
MCV: 82 fL (ref 79–97)
Monocytes Absolute: 0.9 10*3/uL (ref 0.1–0.9)
Monocytes: 10 %
Neutrophils Absolute: 6.6 10*3/uL (ref 1.4–7.0)
Neutrophils: 71 %
Platelets: 446 10*3/uL (ref 150–450)
RBC: 4.29 x10E6/uL (ref 3.77–5.28)
RDW: 15.2 % (ref 11.7–15.4)
Rheumatoid fact SerPl-aCnc: <10 IU/mL (ref 0.0–13.9)
Sed Rate: 40 mm/hr — ABNORMAL HIGH (ref 0–32)
Uric Acid: 4.2 mg/dL (ref 2.6–6.2)
WBC: 9.3 10*3/uL (ref 3.4–10.8)

## 2020-05-04 LAB — CYCLIC CITRUL PEPTIDE ANTIBODY, IGG/IGA: Cyclic Citrullin Peptide Ab: 6 units (ref 0–19)

## 2020-05-04 LAB — HEPATITIS C ANTIBODY: Hep C Virus Ab: <0.1 s/co ratio (ref 0.0–0.9)

## 2020-05-04 LAB — ANTI-DNA ANTIBODY, DOUBLE-STRANDED: dsDNA Ab: 1 IU/mL (ref 0–9)

## 2020-05-04 LAB — LYME AB/WESTERN BLOT REFLEX
LYME DISEASE AB, QUANT, IGM: <0.8 index (ref 0.00–0.79)
Lyme IgG/IgM Ab: <0.91 {ISR} (ref 0.00–0.90)

## 2020-05-04 LAB — EPSTEIN-BARR VIRUS VCA, IGM: EBV VCA IgM: <36 U/mL (ref 0.0–35.9)

## 2020-05-04 LAB — ROCKY MTN SPOTTED FVR ABS PNL(IGG+IGM)
RMSF IgG: NEGATIVE
RMSF IgM: 0.42 index (ref 0.00–0.89)

## 2020-05-04 LAB — EPSTEIN-BARR VIRUS NUCLEAR ANTIGEN ANTIBODY, IGG: EBV NA IgG: 208 U/mL — ABNORMAL HIGH (ref 0.0–17.9)

## 2020-05-04 LAB — VITAMIN B12: Vitamin B-12: 413 pg/mL (ref 232–1245)

## 2020-05-04 LAB — EPSTEIN-BARR VIRUS VCA, IGG: EBV VCA IgG: 39.1 U/mL — ABNORMAL HIGH (ref 0.0–17.9)

## 2020-05-04 LAB — EPSTEIN-BARR VIRUS EARLY D ANTIGEN ANTIBODY, IGG: EBV Early Antigen Ab, IgG: <9 U/mL (ref 0.0–8.9)

## 2020-05-04 LAB — HLA-B27 ANTIGEN: HLA B27: NEGATIVE

## 2020-05-19 ENCOUNTER — Telehealth: Payer: Self-pay | Admitting: Family Medicine

## 2020-05-19 NOTE — Telephone Encounter (Signed)
  Prescription Request  05/19/2020  What is the name of the medication or equipment? prednisone  Have you contacted your pharmacy to request a refill? (if applicable) no  Which pharmacy would you like this sent to? [facility] in [place]   Patient notified that their request is being sent to the clinical staff for review and that they should receive a response within 2 business days.

## 2020-05-19 NOTE — Telephone Encounter (Signed)
Sick since Nov 2020 Inflammation in body - has chronic MONO   Dr Livia Snellen had advised her to cont taking Prednisone. (see last Office note)   Vanessa Harrington - [facility]

## 2020-05-19 NOTE — Telephone Encounter (Signed)
LM for pt = we need more info on WHY she needs Prednisone ( had a recent RX for this)

## 2020-05-19 NOTE — Telephone Encounter (Signed)
What does patient need prednisone for

## 2020-05-23 ENCOUNTER — Other Ambulatory Visit: Payer: Self-pay | Admitting: Family Medicine

## 2020-05-23 DIAGNOSIS — B2701 Gammaherpesviral mononucleosis with polyneuropathy: Secondary | ICD-10-CM

## 2020-05-23 MED ORDER — PREDNISONE 20 MG PO TABS: 40.0000 mg | ORAL_TABLET | Freq: Every day | ORAL | 0 refills | Status: DC

## 2020-06-13 ENCOUNTER — Ambulatory Visit: Payer: BC Managed Care – PPO | Admitting: Family Medicine

## 2020-06-13 ENCOUNTER — Encounter: Payer: Self-pay | Admitting: Family Medicine

## 2020-06-13 ENCOUNTER — Ambulatory Visit (INDEPENDENT_AMBULATORY_CARE_PROVIDER_SITE_OTHER): Payer: Self-pay | Admitting: Family Medicine

## 2020-06-13 ENCOUNTER — Other Ambulatory Visit: Payer: Self-pay

## 2020-06-13 VITALS — BP 136/82 | HR 86 | Temp 98.1°F | Resp 20 | Ht 63.0 in | Wt 169.2 lb

## 2020-06-13 DIAGNOSIS — G3281 Cerebellar ataxia in diseases classified elsewhere: Secondary | ICD-10-CM

## 2020-06-13 DIAGNOSIS — R5382 Chronic fatigue, unspecified: Secondary | ICD-10-CM | POA: Diagnosis not present

## 2020-06-13 DIAGNOSIS — B2701 Gammaherpesviral mononucleosis with polyneuropathy: Secondary | ICD-10-CM

## 2020-06-13 MED ORDER — PREDNISONE 20 MG PO TABS: ORAL_TABLET | ORAL | 1 refills | Status: DC

## 2020-06-13 MED ORDER — DIVALPROEX SODIUM ER 500 MG PO TB24: 500.0000 mg | ORAL_TABLET | Freq: Two times a day (BID) | ORAL | 1 refills | Status: DC

## 2020-06-13 NOTE — Progress Notes (Signed)
Subjective:  Patient ID: Vanessa Harrington, female    DOB: [birth year]  Age: 44 y.o. MRN: [id number]  CC: One month follow up   HPI Vanessa Harrington presents for continuing to shake in her head and hands and feet.  She is not had any improvement this month and her muscles are trembling and twitching.  Her eyes were swelling and she is having a cakey discharge.  She is seeing 3 eye doctors who did not want to culture it.  And she feels like she is got bugs crawling all over her legs.  She went through a lumbar puncture a she is concerned they did not do anything different than what had been done here already.  She is starting occupational therapy next month.  Depression screen [facility] 2/9 06/13/2020 06/13/2020 03/30/2020  Decreased Interest 3 3 1   Down, Depressed, Hopeless 3 3 1   PHQ - 2 Score 6 6 2   Altered sleeping 3 - 3  Tired, decreased energy 3 - 1  Change in appetite 3 - 1  Feeling bad or failure about yourself  0 - 1  Trouble concentrating 3 - 1  Moving slowly or fidgety/restless 2 - 0  Suicidal thoughts 0 - 0  PHQ-9 Score 20 - 9  Difficult doing work/chores Extremely dIfficult - Somewhat difficult    History Alayza has a past medical history of Anxiety, Blood pressure alteration, Depression, Exposure to bat without known bite (10/30/2019), Hypertension, Increased intracranial pressure, and Pseudotumor cerebri.   She has a past surgical history that includes Cholecystectomy (2015); Cesarean section (1999); Mouth surgery (2005); and Tubal ligation (2001).   Her family history includes Heart Problems in her brother and father; Heart disease in her brother, brother, and father; Hypothyroidism in her mother.She reports that she quit smoking about 13 years ago. Her smoking use included cigarettes. She has never used smokeless tobacco. She reports that she does not drink alcohol and does not use drugs.    ROS Review of Systems  Constitutional: Negative for fever.  HENT: Negative for  congestion, rhinorrhea and sore throat.   Respiratory: Negative for cough and shortness of breath.   Cardiovascular: Negative for chest pain and palpitations.  Gastrointestinal: Negative for abdominal pain.  Musculoskeletal: Positive for myalgias. Negative for arthralgias.  Neurological: Positive for tremors.    Objective:  BP 136/82   Pulse 86   Temp 98.1 F (36.7 C) (Temporal)   Resp 20   Ht 5\' 3"  (1.6 m)   Wt 169 lb 4 oz (76.8 kg)   SpO2 97%   BMI 29.98 kg/m   BP Readings from Last 3 Encounters:  06/13/20 136/82  04/27/20 137/88  03/30/20 130/70    Wt Readings from Last 3 Encounters:  06/13/20 169 lb 4 oz (76.8 kg)  04/27/20 171 lb 6.4 oz (77.7 kg)  03/30/20 173 lb 3.2 oz (78.6 kg)     Physical Exam Constitutional:      Appearance: She is well-developed and normal weight. She is ill-appearing.  HENT:     Head: Normocephalic and atraumatic.  Eyes:     Conjunctiva/sclera: Conjunctivae normal.     Pupils: Pupils are equal, round, and reactive to light.  Neck:     Thyroid: No thyromegaly.  Cardiovascular:     Rate and Rhythm: Normal rate and regular rhythm.     Heart sounds: Normal heart sounds. No murmur heard.   Pulmonary:     Effort: Pulmonary effort is normal. No respiratory distress.  Breath sounds: Normal breath sounds. No wheezing or rales.  Abdominal:     General: Bowel sounds are normal. There is no distension.     Palpations: Abdomen is soft.     Tenderness: There is no abdominal tenderness.  Musculoskeletal:        General: Normal range of motion.     Cervical back: Normal range of motion and neck supple.  Lymphadenopathy:     Cervical: No cervical adenopathy.  Skin:    General: Skin is warm and dry.  Neurological:     Mental Status: She is alert and oriented to person, place, and time.     Coordination: Coordination abnormal.  Psychiatric:        Mood and Affect: Mood is anxious and depressed.        Behavior: Behavior is agitated.  Behavior is cooperative.        Thought Content: Thought content normal.        Judgment: Judgment normal.       Assessment & Plan:   Lerin was seen today for one month follow up.  Diagnoses and all orders for this visit:  Gammaherpesviral mononucleosis with polyneuropathy -     predniSONE (DELTASONE) 20 MG tablet; Take 1.5 daily for one week then one daily  Cerebellar ataxia in diseases classified elsewhere ([place]) -     HIV Antibody (routine testing w rflx) -     Antistreptolysin O titer -     Allergen Profile, Mold  Chronic fatigue -     HIV Antibody (routine testing w rflx) -     Antistreptolysin O titer -     Allergen Profile, Mold  Other orders -     divalproex (DEPAKOTE ER) 500 MG 24 hr tablet; Take 1 tablet (500 mg total) by mouth in the morning and at bedtime. For the first week take just one each day in the evening.       I have discontinued [place] [facility]. Phagan's valACYclovir. I have also changed her predniSONE. Additionally, I am having her start on divalproex. Lastly, I am having her maintain her escitalopram and clonazePAM.  Allergies as of 06/13/2020      Reactions   Diamox [acetazolamide] Rash   Due to sulfa allergy   Sulfa Antibiotics Rash      Medication List       Accurate as of June 13, 2020  9:49 PM. If you have any questions, ask your nurse or doctor.        STOP taking these medications   valACYclovir 1000 MG tablet Commonly known as: VALTREX Stopped by: Claretta Fraise, MD     TAKE these medications   clonazePAM 0.5 MG tablet Commonly known as: KLONOPIN Take 1 tablet (0.5 mg total) by mouth at bedtime.   divalproex 500 MG 24 hr tablet Commonly known as: Depakote ER Take 1 tablet (500 mg total) by mouth in the morning and at bedtime. For the first week take just one each day in the evening. Started by: Claretta Fraise, MD   escitalopram 5 MG tablet Commonly known as: Lexapro Take 1 tablet (5 mg total) by mouth daily.   predniSONE  20 MG tablet Commonly known as: DELTASONE Take 1.5 daily for one week then one daily What changed:   how much to take  how to take this  when to take this  additional instructions Changed by: Claretta Fraise, MD        Follow-up: Return in about 1 month (around  07/13/2020).  Claretta Fraise, M.D.

## 2020-06-17 LAB — ALLERGEN PROFILE, MOLD
Alternaria Alternata IgE: <0.1 kU/L
Aspergillus Fumigatus IgE: <0.1 kU/L
Aureobasidi Pullulans IgE: <0.1 kU/L
Candida Albicans IgE: <0.1 kU/L
Cladosporium Herbarum IgE: <0.1 kU/L
M009-IgE Fusarium proliferatum: <0.1 kU/L
M014-IgE Epicoccum purpur: <0.1 kU/L
Mucor Racemosus IgE: <0.1 kU/L
Penicillium Chrysogen IgE: <0.1 kU/L
Phoma Betae IgE: <0.1 kU/L
Setomelanomma Rostrat: <0.1 kU/L
Stemphylium Herbarum IgE: <0.1 kU/L

## 2020-06-17 LAB — ANTISTREPTOLYSIN O TITER: ASO: 162 IU/mL (ref 0.0–200.0)

## 2020-06-17 LAB — HIV ANTIBODY (ROUTINE TESTING W REFLEX): HIV Screen 4th Generation wRfx: NONREACTIVE

## 2020-06-27 DIAGNOSIS — R279 Unspecified lack of coordination: Secondary | ICD-10-CM | POA: Diagnosis not present

## 2020-06-27 DIAGNOSIS — R2689 Other abnormalities of gait and mobility: Secondary | ICD-10-CM | POA: Diagnosis not present

## 2020-06-27 DIAGNOSIS — R262 Difficulty in walking, not elsewhere classified: Secondary | ICD-10-CM | POA: Diagnosis not present

## 2020-06-27 DIAGNOSIS — F444 Conversion disorder with motor symptom or deficit: Secondary | ICD-10-CM | POA: Diagnosis not present

## 2020-06-27 DIAGNOSIS — Z789 Other specified health status: Secondary | ICD-10-CM | POA: Diagnosis not present

## 2020-06-29 ENCOUNTER — Ambulatory Visit: Payer: Self-pay | Admitting: Family Medicine

## 2020-07-13 ENCOUNTER — Encounter: Payer: Self-pay | Admitting: Family Medicine

## 2020-07-13 ENCOUNTER — Ambulatory Visit: Payer: BC Managed Care – PPO | Admitting: Family Medicine

## 2020-07-13 ENCOUNTER — Other Ambulatory Visit: Payer: Self-pay

## 2020-07-13 VITALS — BP 136/82 | HR 78 | Temp 97.8°F | Resp 20 | Ht 63.0 in | Wt 171.2 lb

## 2020-07-13 DIAGNOSIS — R509 Fever, unspecified: Secondary | ICD-10-CM

## 2020-07-13 DIAGNOSIS — G3281 Cerebellar ataxia in diseases classified elsewhere: Secondary | ICD-10-CM | POA: Diagnosis not present

## 2020-07-13 DIAGNOSIS — Z0289 Encounter for other administrative examinations: Secondary | ICD-10-CM

## 2020-07-13 DIAGNOSIS — B2701 Gammaherpesviral mononucleosis with polyneuropathy: Secondary | ICD-10-CM

## 2020-07-13 MED ORDER — SERTRALINE HCL 50 MG PO TABS: 50.0000 mg | ORAL_TABLET | Freq: Every day | ORAL | 5 refills | Status: DC

## 2020-07-13 MED ORDER — PREDNISONE 20 MG PO TABS: 20.0000 mg | ORAL_TABLET | Freq: Every day | ORAL | 1 refills | Status: DC

## 2020-07-13 NOTE — Progress Notes (Signed)
Subjective:  Patient ID: Vanessa Harrington, female    DOB: [birth year]  Age: 44 y.o. MRN: [id number]  CC: 1 month follow up   HPI Vanessa Harrington presents for continued fever of unknown origin.  She has a fever every night and she breaks out in chills and sweats.  She still having double vision in fact she says she frequently just sees 5 of everything.  She has a loss of equilibrium and that she is unsteady on her feet from time to time.  3 weeks ago she went off of all of her medications to see if they were contributing.  She says there was no improvement off of the medications.  On the other hand she was no worse either.  She trills trembles on the inside and has shakes in the hands.  The only improvement over time has been that her legs are less shaky than they once were. Patient went to occupational therapy one time.  At that time she was told she need to work with a cognitive behavioral therapist and physical therapy as well.  As result she did not go back.  Depression screen [facility] 2/9 07/13/2020 06/13/2020 06/13/2020  Decreased Interest _0 Down, Depressed, Hopeless 0 3 3  PHQ - 2 Score _1 Altered sleeping 0 3 -  Tired, decreased energy 3 3 -  Change in appetite 0 3 -  Feeling bad or failure about yourself  0 0 -  Trouble concentrating 3 3 -  Moving slowly or fidgety/restless 2 2 -  Suicidal thoughts 0 0 -  PHQ-9 Score 11 20 -  Difficult doing work/chores Extremely dIfficult Extremely dIfficult -    History Vanessa Harrington has a past medical history of Anxiety, Blood pressure alteration, Depression, Exposure to bat without known bite (10/30/2019), Hypertension, Increased intracranial pressure, and Pseudotumor cerebri.   She has a past surgical history that includes Cholecystectomy (2015); Cesarean section (1999); Mouth surgery (2005); and Tubal ligation (2001).   Her family history includes Heart Problems in her brother and father; Heart disease in her brother, brother, and  father; Hypothyroidism in her mother.She reports that she quit smoking about 13 years ago. Her smoking use included cigarettes. She has never used smokeless tobacco. She reports that she does not drink alcohol and does not use drugs.    ROS Review of Systems  Constitutional: Negative.   HENT: Negative.   Eyes: Positive for visual disturbance.  Respiratory: Negative for shortness of breath.   Cardiovascular: Negative for chest pain.  Gastrointestinal: Negative for abdominal pain.  Musculoskeletal: Negative for arthralgias.  Neurological: Positive for tremors.       Tremor and decreased balance  Psychiatric/Behavioral: The patient is nervous/anxious.     Objective:  BP 136/82   Pulse 78   Temp 97.8 F (36.6 C) (Temporal)   Resp 20   Ht _2  (1.6 m)   Wt 171 lb 4 oz (77.7 kg)   SpO2 99%   BMI 30.34 kg/m   BP Readings from Last 3 Encounters:  07/13/20 136/82  06/13/20 136/82  04/27/20 137/88    Wt Readings from Last 3 Encounters:  07/13/20 171 lb 4 oz (77.7 kg)  06/13/20 169 lb 4 oz (76.8 kg)  04/27/20 171 lb 6.4 oz (77.7 kg)     Physical Exam Constitutional:      General: She is not in acute distress.    Appearance: She is well-developed.  Cardiovascular:  Rate and Rhythm: Normal rate and regular rhythm.  Pulmonary:     Breath sounds: Normal breath sounds.  Skin:    General: Skin is warm and dry.  Neurological:     Mental Status: She is alert and oriented to person, place, and time.       Assessment & Plan:   Vanessa Harrington was seen today for 1 month follow up.  Diagnoses and all orders for this visit:  Cerebellar ataxia in diseases classified elsewhere ([place]) -     CBC with Differential/Platelet -     CMP14+EGFR -     Sedimentation rate -     Magnesium -     Ambulatory referral to Infectious Disease  Gammaherpesviral mononucleosis with polyneuropathy -     CBC with Differential/Platelet -     CMP14+EGFR -     Sedimentation rate -     Magnesium -      predniSONE (DELTASONE) 20 MG tablet; Take 1 tablet (20 mg total) by mouth daily with breakfast. -     Ambulatory referral to Infectious Disease  Fever, unspecified fever cause -     Ambulatory referral to Infectious Disease  Other orders -     sertraline (ZOLOFT) 50 MG tablet; Take 1 tablet (50 mg total) by mouth at bedtime. For anxiety and depression       I have discontinued Vanessa Harrington's escitalopram, clonazePAM, and divalproex. I have also changed her predniSONE. Additionally, I am having her start on sertraline. Lastly, I am having her maintain her Turmeric (QC TUMERIC COMPLEX PO).  Allergies as of 07/13/2020      Reactions   Diamox [acetazolamide] Rash   Due to sulfa allergy   Sulfa Antibiotics Rash      Medication List       Accurate as of July 13, 2020 10:56 AM. If you have any questions, ask your nurse or doctor.        STOP taking these medications   clonazePAM 0.5 MG tablet Commonly known as: KLONOPIN Stopped by: Claretta Fraise, MD   divalproex 500 MG 24 hr tablet Commonly known as: Depakote ER Stopped by: Claretta Fraise, MD   escitalopram 5 MG tablet Commonly known as: Lexapro Stopped by: Claretta Fraise, MD     TAKE these medications   predniSONE 20 MG tablet Commonly known as: DELTASONE Take 1 tablet (20 mg total) by mouth daily with breakfast. What changed:   how much to take  how to take this  when to take this  additional instructions Changed by: Claretta Fraise, MD   QC TUMERIC COMPLEX PO Take 3,000 mg by mouth.   sertraline 50 MG tablet Commonly known as: ZOLOFT Take 1 tablet (50 mg total) by mouth at bedtime. For anxiety and depression Started by: Claretta Fraise, MD        Follow-up: Return in about 1 month (around 08/13/2020).  Claretta Fraise, M.D.

## 2020-07-14 LAB — CMP14+EGFR
ALT: 11 IU/L (ref 0–32)
AST: 12 IU/L (ref 0–40)
Albumin/Globulin Ratio: 1.7 (ref 1.2–2.2)
Albumin: 4 g/dL (ref 3.8–4.8)
Alkaline Phosphatase: 91 IU/L (ref 48–121)
BUN/Creatinine Ratio: 10 (ref 9–23)
BUN: 8 mg/dL (ref 6–24)
Bilirubin Total: 0.2 mg/dL (ref 0.0–1.2)
CO2: 22 mmol/L (ref 20–29)
Calcium: 8.8 mg/dL (ref 8.7–10.2)
Chloride: 106 mmol/L (ref 96–106)
Creatinine, Ser: 0.81 mg/dL (ref 0.57–1.00)
GFR calc Af Amer: 103 mL/min/{1.73_m2} (ref >59)
GFR calc non Af Amer: 89 mL/min/{1.73_m2} (ref >59)
Globulin, Total: 2.3 g/dL (ref 1.5–4.5)
Glucose: 82 mg/dL (ref 65–99)
Potassium: 4.3 mmol/L (ref 3.5–5.2)
Sodium: 139 mmol/L (ref 134–144)
Total Protein: 6.3 g/dL (ref 6.0–8.5)

## 2020-07-14 LAB — CBC WITH DIFFERENTIAL/PLATELET
Basophils Absolute: 0 10*3/uL (ref 0.0–0.2)
Basos: 1 %
EOS (ABSOLUTE): 0.1 10*3/uL (ref 0.0–0.4)
Eos: 2 %
Hematocrit: 36.4 % (ref 34.0–46.6)
Hemoglobin: 11.3 g/dL (ref 11.1–15.9)
Immature Grans (Abs): 0 10*3/uL (ref 0.0–0.1)
Immature Granulocytes: 0 %
Lymphocytes Absolute: 1.4 10*3/uL (ref 0.7–3.1)
Lymphs: 19 %
MCH: 25.3 pg — ABNORMAL LOW (ref 26.6–33.0)
MCHC: 31 g/dL — ABNORMAL LOW (ref 31.5–35.7)
MCV: 82 fL (ref 79–97)
Monocytes Absolute: 0.6 10*3/uL (ref 0.1–0.9)
Monocytes: 9 %
Neutrophils Absolute: 5 10*3/uL (ref 1.4–7.0)
Neutrophils: 69 %
Platelets: 472 10*3/uL — ABNORMAL HIGH (ref 150–450)
RBC: 4.46 x10E6/uL (ref 3.77–5.28)
RDW: 14.3 % (ref 11.7–15.4)
WBC: 7.3 10*3/uL (ref 3.4–10.8)

## 2020-07-14 LAB — MAGNESIUM: Magnesium: 2 mg/dL (ref 1.6–2.3)

## 2020-07-14 LAB — SEDIMENTATION RATE: Sed Rate: 22 mm/hr (ref 0–32)

## 2020-07-17 NOTE — Progress Notes (Signed)
Hello Natascha,  Your lab result is normal and/or stable.Some minor variations that are not significant are commonly marked abnormal, but do not represent any medical problem for you.  Best regards, Jillyan Plitt, M.D.

## 2020-08-03 IMAGING — CR DG CHEST 1V PORT
1 series · 1 of 1 positions shown · non-contrast
Comparison: Report from prior chest radiographs 10/17/2014 (images
unavailable).

CLINICAL DATA: Chills.

EXAM:
PORTABLE CHEST 1 VIEW

[portable]
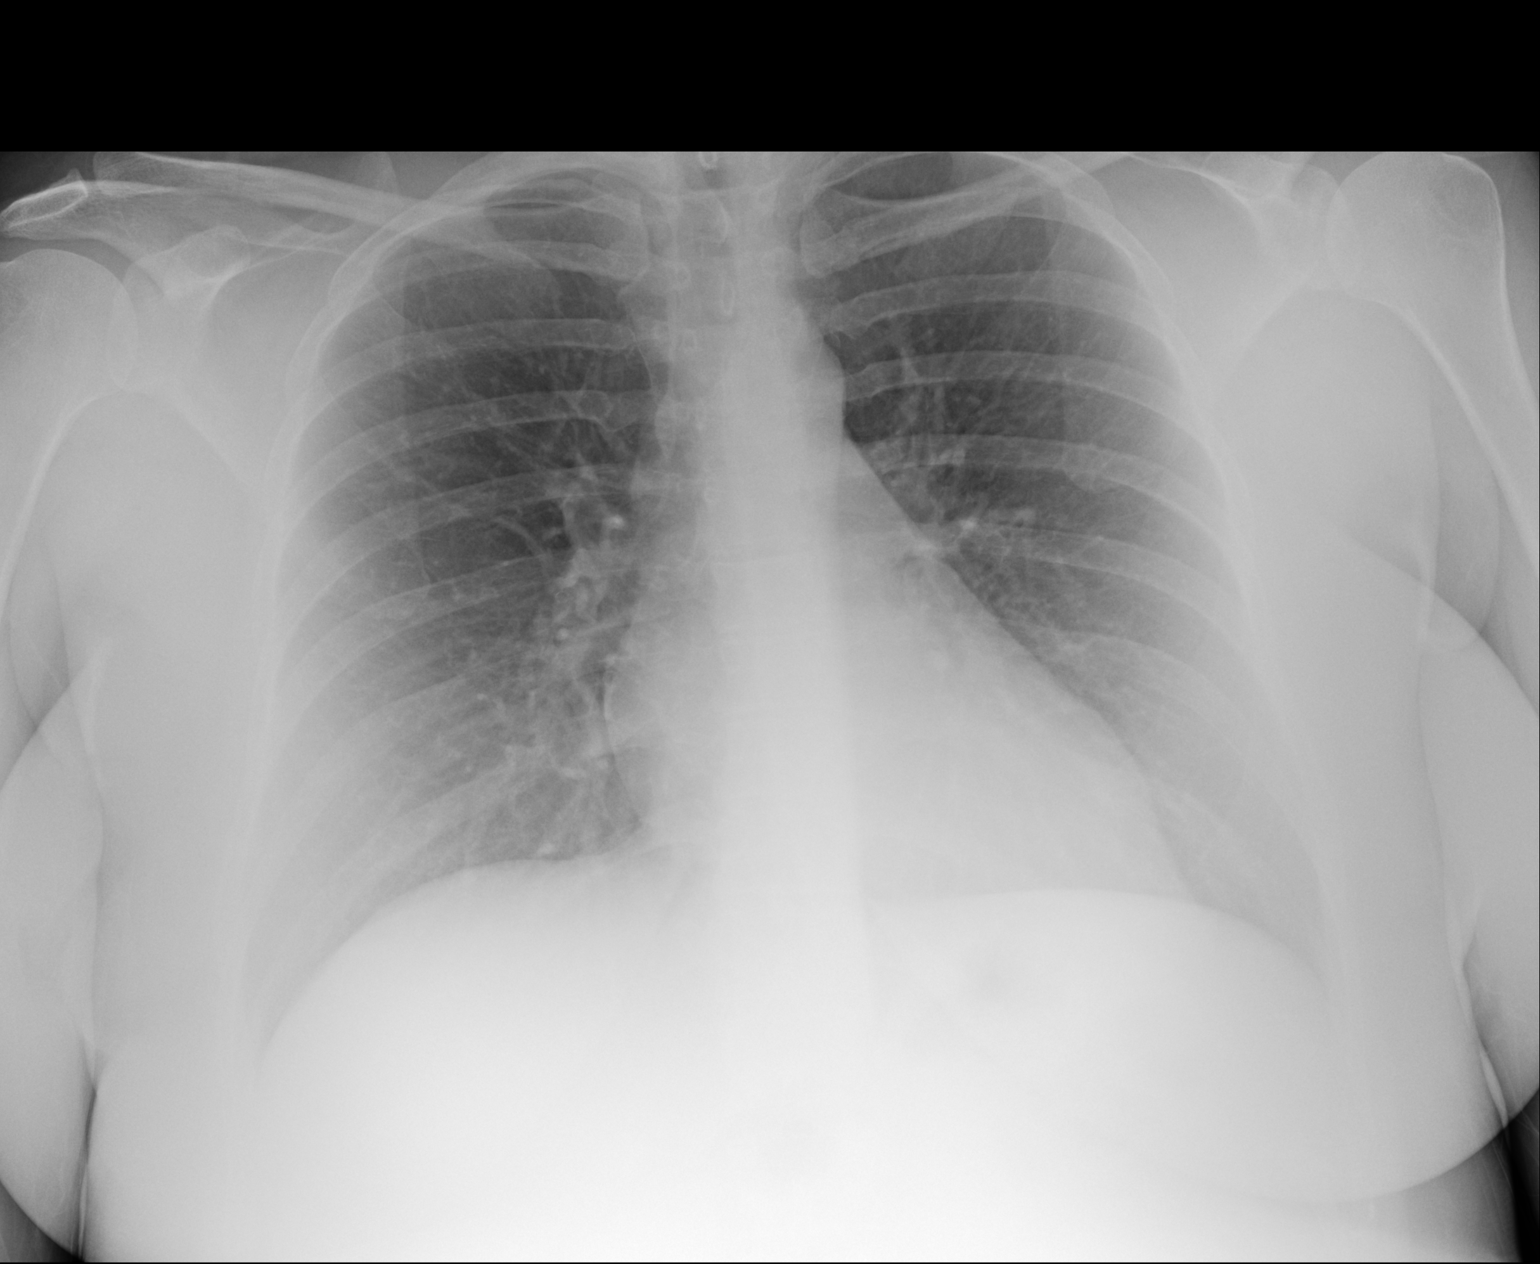

[1 of 1 positions shown; findings below may reference images not displayed]

FINDINGS: Heart size within normal limits.

There is no airspace consolidation within the lungs.

No evidence of pleural effusion or pneumothorax.

No acute bony abnormality.
IMPRESSION: No evidence of acute cardiopulmonary abnormality.

## 2020-08-17 ENCOUNTER — Ambulatory Visit: Payer: BC Managed Care – PPO | Admitting: Family Medicine

## 2020-10-10 ENCOUNTER — Ambulatory Visit: Payer: BC Managed Care – PPO | Admitting: Family Medicine

## 2020-10-13 DIAGNOSIS — E039 Hypothyroidism, unspecified: Secondary | ICD-10-CM | POA: Diagnosis not present

## 2020-10-13 DIAGNOSIS — R5383 Other fatigue: Secondary | ICD-10-CM | POA: Diagnosis not present

## 2020-10-13 DIAGNOSIS — E063 Autoimmune thyroiditis: Secondary | ICD-10-CM | POA: Diagnosis not present

## 2020-10-13 DIAGNOSIS — E782 Mixed hyperlipidemia: Secondary | ICD-10-CM | POA: Diagnosis not present

## 2020-10-13 DIAGNOSIS — E8881 Metabolic syndrome: Secondary | ICD-10-CM | POA: Diagnosis not present

## 2020-10-13 DIAGNOSIS — E559 Vitamin D deficiency, unspecified: Secondary | ICD-10-CM | POA: Diagnosis not present

## 2020-10-13 DIAGNOSIS — R7982 Elevated C-reactive protein (CRP): Secondary | ICD-10-CM | POA: Diagnosis not present

## 2020-11-07 ENCOUNTER — Other Ambulatory Visit: Payer: Self-pay

## 2020-11-07 ENCOUNTER — Ambulatory Visit (INDEPENDENT_AMBULATORY_CARE_PROVIDER_SITE_OTHER): Payer: BC Managed Care – PPO | Admitting: Family Medicine

## 2020-11-07 ENCOUNTER — Encounter: Payer: Self-pay | Admitting: Family Medicine

## 2020-11-07 VITALS — BP 146/86 | HR 81 | Temp 98.0°F | Ht 63.0 in | Wt 175.4 lb

## 2020-11-07 DIAGNOSIS — B2701 Gammaherpesviral mononucleosis with polyneuropathy: Secondary | ICD-10-CM | POA: Diagnosis not present

## 2020-11-07 DIAGNOSIS — G3281 Cerebellar ataxia in diseases classified elsewhere: Secondary | ICD-10-CM | POA: Diagnosis not present

## 2020-11-07 DIAGNOSIS — R251 Tremor, unspecified: Secondary | ICD-10-CM

## 2020-11-07 DIAGNOSIS — R5382 Chronic fatigue, unspecified: Secondary | ICD-10-CM | POA: Diagnosis not present

## 2020-11-07 DIAGNOSIS — R509 Fever, unspecified: Secondary | ICD-10-CM | POA: Diagnosis not present

## 2020-11-07 MED ORDER — FLUCONAZOLE 100 MG PO TABS: ORAL_TABLET | ORAL | 0 refills | Status: DC

## 2020-11-07 NOTE — Progress Notes (Signed)
Subjective:  Patient ID: Doneen Poisson, female    DOB: Mar 16, 1976  Age: 44 y.o. MRN: [id number]  CC: Follow-up   HPI Vanessa Harrington presents for follow-up of her mono-like symptoms.  She continues to have tremors and shakes.  She remains tired and weak.  Her symptoms are no worse but also no better.  She did have limited improvement with the prednisone but symptoms recurred after discontinuing the medicine from prolonged use.  She is also discontinued the Zoloft and her turmeric.  She has seen a holistic provider that recommended multiple tests this includes some of her own research to.  She has a carnitine deficiency and is started taking l-carnitine 800 mg.  She has request to recheck her cholesterol EBV titers and Covid antibodies.  She wants her cortisol and progesterone checked as well.  She is asked for a tryptase evaluation a CD57 test and a coxsackie viral test.  She reports that she is taking 1000 mg a day vitamin C 50 mg of iron the l-carnitine 800 mg.  Magnesium glycine 8 240 mg vitamin D3 70 mg and trizomal glutathione 5 mg  Depression screen [facility] 2/9 11/07/2020 07/13/2020 06/13/2020  Decreased Interest 0 3 3  Down, Depressed, Hopeless 0 0 3  PHQ - 2 Score 0 3 6  Altered sleeping - 0 3  Tired, decreased energy - 3 3  Change in appetite - 0 3  Feeling bad or failure about yourself  - 0 0  Trouble concentrating - 3 3  Moving slowly or fidgety/restless - 2 2  Suicidal thoughts - 0 0  PHQ-9 Score - 11 20  Difficult doing work/chores - Extremely dIfficult Extremely dIfficult    History Caliann has a past medical history of Anxiety, Blood pressure alteration, Depression, Exposure to bat without known bite (10/30/2019), Hypertension, Increased intracranial pressure, and Pseudotumor cerebri.   She has a past surgical history that includes Cholecystectomy (2015); Cesarean section (1999); Mouth surgery (2005); and Tubal ligation (2001).   Her family history includes Heart  Problems in her brother and father; Heart disease in her brother, brother, and father; Hypothyroidism in her mother.She reports that she quit smoking about 13 years ago. Her smoking use included cigarettes. She has never used smokeless tobacco. She reports that she does not drink alcohol and does not use drugs.    ROS Review of Systems  Constitutional: Positive for fatigue.  HENT: Negative.   Respiratory: Negative for shortness of breath.   Cardiovascular: Negative for chest pain.  Gastrointestinal: Negative for abdominal pain.  Musculoskeletal: Positive for arthralgias and myalgias.  Neurological: Positive for tremors.  Psychiatric/Behavioral: Positive for sleep disturbance.    Objective:  BP (!) 146/86   Pulse 81   Temp 98 F (36.7 C) (Temporal)   Ht 5\' 3"  (1.6 m)   Wt 175 lb 6.4 oz (79.6 kg)   BMI 31.07 kg/m   BP Readings from Last 3 Encounters:  11/07/20 (!) 146/86  07/13/20 136/82  06/13/20 136/82    Wt Readings from Last 3 Encounters:  11/07/20 175 lb 6.4 oz (79.6 kg)  07/13/20 171 lb 4 oz (77.7 kg)  06/13/20 169 lb 4 oz (76.8 kg)     Physical Exam Constitutional:      General: She is not in acute distress.    Appearance: She is well-developed.  HENT:     Head: Normocephalic and atraumatic.  Eyes:     Conjunctiva/sclera: Conjunctivae normal.     Pupils: Pupils are  equal, round, and reactive to light.  Neck:     Thyroid: No thyromegaly.  Cardiovascular:     Rate and Rhythm: Normal rate and regular rhythm.     Heart sounds: Normal heart sounds. No murmur heard.   Pulmonary:     Effort: Pulmonary effort is normal. No respiratory distress.     Breath sounds: Normal breath sounds. No wheezing or rales.  Abdominal:     General: There is no distension.     Palpations: Abdomen is soft.     Tenderness: There is no abdominal tenderness.  Musculoskeletal:        General: Normal range of motion.     Cervical back: Normal range of motion and neck supple.    Lymphadenopathy:     Cervical: No cervical adenopathy.  Skin:    General: Skin is warm and dry.  Neurological:     Mental Status: She is alert and oriented to person, place, and time.  Psychiatric:        Behavior: Behavior normal.        Thought Content: Thought content normal.        Judgment: Judgment normal.       Assessment & Plan:   Perle was seen today for follow-up.  Diagnoses and all orders for this visit:  Cerebellar ataxia in diseases classified elsewhere ([place]) -     Cortisol -     Progesterone -     Lipid panel -     Epstein-Barr virus nuclear antigen antibody, IgG -     Cancel: Epstein-Barr virus VCA antibody panel -     Cancel: EBV ab to viral capsid ag pnl, IgG+IgM -     SARS-CoV-2 Semi-Quantitative Total Antibody, Spike -     Tryptase -     HNK1 (CD57) Panel -     Coxsackie B virus antibodies -     Coxsackie A virus antibodies -     EBV ab to viral capsid ag pnl, IgG+IgM -     Epstein-Barr virus VCA, IgM -     Epstein-Barr virus VCA, IgG  Gammaherpesviral mononucleosis with polyneuropathy -     Cortisol -     Progesterone -     Lipid panel -     Epstein-Barr virus nuclear antigen antibody, IgG -     Cancel: Epstein-Barr virus VCA antibody panel -     Cancel: EBV ab to viral capsid ag pnl, IgG+IgM -     SARS-CoV-2 Semi-Quantitative Total Antibody, Spike -     Tryptase -     HNK1 (CD57) Panel -     Coxsackie B virus antibodies -     Coxsackie A virus antibodies -     EBV ab to viral capsid ag pnl, IgG+IgM -     Epstein-Barr virus VCA, IgM -     Epstein-Barr virus VCA, IgG  Fever, unspecified fever cause -     Cortisol -     Progesterone -     Lipid panel -     Epstein-Barr virus nuclear antigen antibody, IgG -     Cancel: Epstein-Barr virus VCA antibody panel -     Cancel: EBV ab to viral capsid ag pnl, IgG+IgM -     SARS-CoV-2 Semi-Quantitative Total Antibody, Spike -     Tryptase -     HNK1 (CD57) Panel -     Coxsackie B virus  antibodies -     Coxsackie A virus antibodies -  EBV ab to viral capsid ag pnl, IgG+IgM -     Epstein-Barr virus VCA, IgM -     Epstein-Barr virus VCA, IgG  Chronic fatigue -     Cortisol -     Progesterone -     Lipid panel -     Epstein-Barr virus nuclear antigen antibody, IgG -     Cancel: Epstein-Barr virus VCA antibody panel -     Cancel: EBV ab to viral capsid ag pnl, IgG+IgM -     SARS-CoV-2 Semi-Quantitative Total Antibody, Spike -     Tryptase -     HNK1 (CD57) Panel -     Coxsackie B virus antibodies -     Coxsackie A virus antibodies -     EBV ab to viral capsid ag pnl, IgG+IgM -     Epstein-Barr virus VCA, IgM -     Epstein-Barr virus VCA, IgG  Tremor of unknown origin -     Cortisol -     Progesterone -     Lipid panel -     Epstein-Barr virus nuclear antigen antibody, IgG -     Cancel: Epstein-Barr virus VCA antibody panel -     Cancel: EBV ab to viral capsid ag pnl, IgG+IgM -     SARS-CoV-2 Semi-Quantitative Total Antibody, Spike -     Tryptase -     HNK1 (CD57) Panel -     Coxsackie B virus antibodies -     Coxsackie A virus antibodies -     EBV ab to viral capsid ag pnl, IgG+IgM -     Epstein-Barr virus VCA, IgM -     Epstein-Barr virus VCA, IgG  Other orders -     fluconazole (DIFLUCAN) 100 MG tablet; Take two with first dose. Then starting the next day take one daily until all are taken.       I have discontinued Santa Genera. Regner's predniSONE and sertraline. I am also having her start on fluconazole. Additionally, I am having her maintain her Turmeric (QC TUMERIC COMPLEX PO), vitamin C, Ferrous Gluconate-C-Folic Acid (IRON-C PO), and Magnesium.  Allergies as of 11/07/2020      Reactions   Diamox [acetazolamide] Rash   Due to sulfa allergy   Sulfa Antibiotics Rash      Medication List       Accurate as of November 07, 2020 11:59 PM. If you have any questions, ask your nurse or doctor.        STOP taking these medications     predniSONE 20 MG tablet Commonly known as: DELTASONE Stopped by: Claretta Fraise, MD   sertraline 50 MG tablet Commonly known as: ZOLOFT Stopped by: Claretta Fraise, MD     TAKE these medications   fluconazole 100 MG tablet Commonly known as: Diflucan Take two with first dose. Then starting the next day take one daily until all are taken. Started by: Claretta Fraise, MD   IRON-C PO Take by mouth.   Magnesium 250 MG Tabs Take by mouth.   QC TUMERIC COMPLEX PO Take 3,000 mg by mouth.   vitamin C 1000 MG tablet Take 1,000 mg by mouth daily.        Follow-up: Return in about 2 months (around 01/07/2021).  Claretta Fraise, M.D.

## 2020-11-08 ENCOUNTER — Encounter: Payer: Self-pay | Admitting: Family Medicine

## 2020-11-08 ENCOUNTER — Other Ambulatory Visit: Payer: BC Managed Care – PPO

## 2020-11-08 DIAGNOSIS — B2701 Gammaherpesviral mononucleosis with polyneuropathy: Secondary | ICD-10-CM | POA: Diagnosis not present

## 2020-11-08 DIAGNOSIS — R509 Fever, unspecified: Secondary | ICD-10-CM | POA: Diagnosis not present

## 2020-11-08 DIAGNOSIS — R251 Tremor, unspecified: Secondary | ICD-10-CM | POA: Diagnosis not present

## 2020-11-08 DIAGNOSIS — R5382 Chronic fatigue, unspecified: Secondary | ICD-10-CM | POA: Diagnosis not present

## 2020-11-08 LAB — LIPID PANEL

## 2020-11-09 LAB — SARS-COV-2 SEMI-QUANTITATIVE TOTAL ANTIBODY, SPIKE
SARS-CoV-2 Semi-Quant Total Ab: <0.8 U/mL (ref <0.8)
SARS-CoV-2 Spike Ab Interp: NEGATIVE

## 2020-11-10 LAB — COXSACKIE B VIRUS ANTIBODIES
Coxsackie B1 Ab: 1:16 {titer} — ABNORMAL HIGH
Coxsackie B2 Ab: 1:16 {titer} — ABNORMAL HIGH
Coxsackie B3 Ab: 1:8 {titer} — ABNORMAL HIGH
Coxsackie B4 Ab: 1:8 {titer} — ABNORMAL HIGH
Coxsackie B5 Ab: 1:16 {titer} — ABNORMAL HIGH
Coxsackie B6 Ab: 1:16 {titer} — ABNORMAL HIGH

## 2020-11-10 LAB — HNK1 (CD57) PANEL
% CD8-/CD57+ Lymphs: 3.3 % (ref 2.0–17.0)
Abs.CD8-CD57+ Lymphs: 46 /uL — ABNORMAL LOW (ref 60–360)
Basophils Absolute: 0 10*3/uL (ref 0.0–0.2)
Basos: 1 %
EOS (ABSOLUTE): 0.3 10*3/uL (ref 0.0–0.4)
Eos: 6 %
Hematocrit: 37.1 % (ref 34.0–46.6)
Hemoglobin: 11.8 g/dL (ref 11.1–15.9)
Immature Grans (Abs): 0 10*3/uL (ref 0.0–0.1)
Immature Granulocytes: 0 %
Lymphocytes Absolute: 1.4 10*3/uL (ref 0.7–3.1)
Lymphs: 27 %
MCH: 27 pg (ref 26.6–33.0)
MCHC: 31.8 g/dL (ref 31.5–35.7)
MCV: 85 fL (ref 79–97)
Monocytes Absolute: 0.5 10*3/uL (ref 0.1–0.9)
Monocytes: 9 %
Neutrophils Absolute: 2.9 10*3/uL (ref 1.4–7.0)
Neutrophils: 57 %
Platelets: 333 10*3/uL (ref 150–450)
RBC: 4.37 x10E6/uL (ref 3.77–5.28)
RDW: 15.2 % (ref 11.7–15.4)
WBC: 5.1 10*3/uL (ref 3.4–10.8)

## 2020-11-10 LAB — EPSTEIN-BARR VIRUS NUCLEAR ANTIGEN ANTIBODY, IGG: EBV NA IgG: 305 U/mL — ABNORMAL HIGH (ref 0.0–17.9)

## 2020-11-10 LAB — LIPID PANEL
Chol/HDL Ratio: 3.2 ratio (ref 0.0–4.4)
Cholesterol, Total: 157 mg/dL (ref 100–199)
HDL: 49 mg/dL (ref >39)
LDL Chol Calc (NIH): 94 mg/dL (ref 0–99)
Triglycerides: 72 mg/dL (ref 0–149)
VLDL Cholesterol Cal: 14 mg/dL (ref 5–40)

## 2020-11-10 LAB — CORTISOL: Cortisol: 18.5 ug/dL

## 2020-11-10 LAB — EBV AB TO VIRAL CAPSID AG PNL, IGG+IGM
EBV VCA IgG: 39.2 U/mL — ABNORMAL HIGH (ref 0.0–17.9)
EBV VCA IgM: <36 U/mL (ref 0.0–35.9)

## 2020-11-10 LAB — COXSACKIE A VIRUS ANTIBODIES
Coxsackie A16 IgG: 1:400 {titer} — ABNORMAL HIGH
Coxsackie A16 IgM: NEGATIVE titer
Coxsackie A24 IgG: 1:400 {titer} — ABNORMAL HIGH
Coxsackie A24 IgM: NEGATIVE titer
Coxsackie A7 IgG: 1:400 {titer} — ABNORMAL HIGH
Coxsackie A7 IgM: NEGATIVE titer
Coxsackie A9 IgG: 1:400 {titer} — ABNORMAL HIGH
Coxsackie A9 IgM: NEGATIVE titer

## 2020-11-10 LAB — TRYPTASE: Tryptase: 6.1 ug/L (ref 2.2–13.2)

## 2020-11-10 LAB — PROGESTERONE: Progesterone: 0.2 ng/mL

## 2020-11-20 DIAGNOSIS — F444 Conversion disorder with motor symptom or deficit: Secondary | ICD-10-CM | POA: Diagnosis not present

## 2020-11-23 DIAGNOSIS — Z6832 Body mass index (BMI) 32.0-32.9, adult: Secondary | ICD-10-CM | POA: Diagnosis not present

## 2020-11-23 DIAGNOSIS — Z01419 Encounter for gynecological examination (general) (routine) without abnormal findings: Secondary | ICD-10-CM | POA: Diagnosis not present

## 2020-11-23 DIAGNOSIS — Z1151 Encounter for screening for human papillomavirus (HPV): Secondary | ICD-10-CM | POA: Diagnosis not present

## 2020-11-23 DIAGNOSIS — Z1231 Encounter for screening mammogram for malignant neoplasm of breast: Secondary | ICD-10-CM | POA: Diagnosis not present

## 2020-12-19 DIAGNOSIS — D259 Leiomyoma of uterus, unspecified: Secondary | ICD-10-CM | POA: Diagnosis not present

## 2020-12-19 DIAGNOSIS — N920 Excessive and frequent menstruation with regular cycle: Secondary | ICD-10-CM | POA: Diagnosis not present

## 2021-01-08 ENCOUNTER — Ambulatory Visit: Payer: BC Managed Care – PPO | Admitting: Family Medicine

## 2021-01-16 ENCOUNTER — Other Ambulatory Visit: Payer: Self-pay

## 2021-01-16 ENCOUNTER — Encounter: Payer: Self-pay | Admitting: Family

## 2021-01-16 ENCOUNTER — Ambulatory Visit (INDEPENDENT_AMBULATORY_CARE_PROVIDER_SITE_OTHER): Payer: BC Managed Care – PPO | Admitting: Family

## 2021-01-16 ENCOUNTER — Ambulatory Visit (INDEPENDENT_AMBULATORY_CARE_PROVIDER_SITE_OTHER): Payer: BC Managed Care – PPO

## 2021-01-16 VITALS — BP 133/85 | HR 79 | Temp 98.1°F | Ht 63.0 in | Wt 185.4 lb

## 2021-01-16 DIAGNOSIS — W009XXA Unspecified fall due to ice and snow, initial encounter: Secondary | ICD-10-CM | POA: Diagnosis not present

## 2021-01-16 DIAGNOSIS — S5001XA Contusion of right elbow, initial encounter: Secondary | ICD-10-CM | POA: Diagnosis not present

## 2021-01-16 DIAGNOSIS — M25521 Pain in right elbow: Secondary | ICD-10-CM | POA: Diagnosis not present

## 2021-01-16 MED ORDER — DICLOFENAC SODIUM 75 MG PO TBEC: 75.0000 mg | DELAYED_RELEASE_TABLET | Freq: Two times a day (BID) | ORAL | 0 refills | Status: DC

## 2021-01-16 NOTE — Progress Notes (Signed)
Subjective:    Patient ID: Vanessa Harrington, female    DOB: 05-Jun-1976, 45 y.o.   MRN: [id number]   Chief Complaint  Patient presents with  . Fall    This past weekend hurt Right arm from shoulder all the way down to hand.   Pt presents to the office today with complaints of right shoulder and arm pain. She reports she slipped on ice on Sunday and fell on her buttock. However, when she fell her arm was extended out to try to catch her.  Fall The accident occurred 2 days ago. She landed on concrete. There was no blood loss. The pain is present in the right shoulder and right elbow. The pain is at a severity of 5/10 (when extending). The pain is moderate. Pertinent negatives include no numbness (no new) or tingling (no new). She has tried ice, rest and NSAID for the symptoms. The treatment provided mild relief.      Review of Systems  Neurological: Negative for tingling (no new) and numbness (no new).  All other systems reviewed and are negative.      Objective:   Physical Exam Vitals reviewed.  Constitutional:      General: She is not in acute distress.    Appearance: She is well-developed and well-nourished. She is obese.  HENT:     Head: Normocephalic and atraumatic.     Mouth/Throat:     Mouth: Oropharynx is clear and moist.  Eyes:     Pupils: Pupils are equal, round, and reactive to light.  Neck:     Thyroid: No thyromegaly.  Cardiovascular:     Rate and Rhythm: Normal rate and regular rhythm.     Pulses: Intact distal pulses.     Heart sounds: Normal heart sounds. No murmur heard.   Pulmonary:     Effort: Pulmonary effort is normal. No respiratory distress.     Breath sounds: Normal breath sounds. No wheezing.  Abdominal:     General: Bowel sounds are normal. There is no distension.     Palpations: Abdomen is soft.     Tenderness: There is no abdominal tenderness.  Musculoskeletal:        General: Tenderness present. No edema.     Cervical back: Normal  range of motion and neck supple.     Comments: Pain in posterior elbow with extension, mild swelling  Skin:    General: Skin is warm and dry.  Neurological:     Mental Status: She is alert and oriented to person, place, and time.     Cranial Nerves: No cranial nerve deficit.     Deep Tendon Reflexes: Reflexes are normal and symmetric.  Psychiatric:        Mood and Affect: Mood and affect normal.        Behavior: Behavior normal.        Thought Content: Thought content normal.        Judgment: Judgment normal.      BP 133/85   Pulse 79   Temp 98.1 F (36.7 C) (Temporal)   Ht 5\' 3"  (1.6 m)   Wt 185 lb 6.4 oz (84.1 kg)   BMI 32.84 kg/m      Assessment & Plan:  Vanessa Harrington comes in today with chief complaint of Fall (This past weekend hurt Right arm from shoulder all the way down to hand.)   Diagnosis and orders addressed:  1. Fall due to slipping on ice or snow, initial encounter -  diclofenac (VOLTAREN) 75 MG EC tablet; Take 1 tablet (75 mg total) by mouth 2 (two) times daily.  Dispense: 60 tablet; Refill: 0 - DG Elbow 2 Views Right; Future  2. Right elbow pain - diclofenac (VOLTAREN) 75 MG EC tablet; Take 1 tablet (75 mg total) by mouth 2 (two) times daily.  Dispense: 60 tablet; Refill: 0 - DG Elbow 2 Views Right; Future  3. Contusion of right elbow, initial encounter Rest Ice Compression Elevation  No other NSAID's while taking diclofenac    Vanessa Dun, FNP

## 2021-01-16 NOTE — Patient Instructions (Signed)
Elbow Contusion An elbow contusion is a deep bruise of the elbow. Contusions are the result of a blunt injury to tissues and muscle fibers under the skin. The injury causes bleeding under the skin. The skin overlying the contusion may turn blue, purple, or yellow. Minor injuries will give you a painless contusion, but more severe contusions may stay painful and swollen for a few weeks. What are the causes? Common causes of this condition include:  A hard hit to the elbow.  An injury (trauma) to the elbow.  Direct force on the elbow, such as from a fall. What increases the risk? You are more likely to develop this condition if you:  Play sports or do other physical activities.  Use blood thinners. What are the signs or symptoms? Symptoms of this condition include:  Swelling of the elbow.  Pain and tenderness of the elbow.  Discoloration of the elbow. The area may have redness and then turn blue, purple, or yellow. How is this diagnosed? This condition is diagnosed based on:  Your symptoms and medical history.  A physical exam. You may also need an X-ray to determine if there are any associated injuries, such as broken bones (fractures). An MRI might be done if the swelling and pain do not go away in a few weeks. How is this treated? This condition may be treated with:  Rest, ice, pressure (compression), and elevation. This is often called RICE therapy. In general, this is considered the best treatment for this condition.  A sling or splint to support your injury.  Over-the-counter anti-inflammatory medicines, such as ibuprofen, for pain control.  Range-of-motion exercises. Follow these instructions at home: RICE therapy  Rest the injured area.  If directed, put ice on the injured area: ? If you have a removable sling or splint, remove it as told by your health care provider. ? Put ice in a plastic bag. ? Place a towel between your skin and the bag. ? Leave the ice on  for 20 minutes, 2-3 times a day.  If directed, apply light compression to the injured area using an elastic bandage. Make sure the bandage is not wrapped too tightly. Remove and reapply the bandage as directed by your health care provider.  Raise (elevate) the injured area above the level of your heart while you are sitting or lying down. If you have a sling or splint:  Wear the sling or splint as told by your health care provider. Remove it only as told by your health care provider.  Loosen the sling or splint if your fingers tingle, become numb, or turn cold and blue.  Keep the sling or splint clean.  If the sling or splint is not waterproof: ? Do not let it get wet. ? Cover it with a watertight covering when you take a bath or a shower.   General instructions  Take over-the-counter and prescription medicines only as told by your health care provider.  Return to your normal activities as told by your health care provider. Ask your health care provider what activities are safe for you.  Do range-of-motion exercises only as told by your health care provider.  Ask your health care provider when it is safe to drive if you have a sling or splint on your arm.  Wear elbow pads as told by your health care provider.  Keep all follow-up visits as told by your health care provider. This is important. Contact a health care provider if:  Your symptoms  do not improve after several days of treatment.  You have more redness, swelling, or pain in your elbow.  You have difficulty moving the injured area.  Your swelling or pain is not relieved with medicines. Get help right away if:  Your skin over the contusion breaks and starts bleeding.  You have severe pain.  You have numbness in your hand or fingers.  Your hand or fingers turn pale or cold.  You have swelling of your hand and fingers.  You cannot move your fingers or wrist. Summary  An elbow contusion is a deep bruise of the  elbow.  Symptoms include pain, swelling, and discoloration of the elbow.  Rest the injured area and apply ice to the area as told by your health care provider.  If directed, apply light compression to the injured area using an elastic bandage.  Raise (elevate) the injured area above the level of your heart while you are sitting or lying down. This information is not intended to replace advice given to you by your health care provider. Make sure you discuss any questions you have with your health care provider. Document Revised: 06/11/2018 Document Reviewed: 06/11/2018 Elsevier Patient Education  2021 [employer].

## 2021-02-02 DIAGNOSIS — R799 Abnormal finding of blood chemistry, unspecified: Secondary | ICD-10-CM | POA: Diagnosis not present

## 2021-02-02 DIAGNOSIS — Z77120 Contact with and (suspected) exposure to mold (toxic): Secondary | ICD-10-CM | POA: Diagnosis not present

## 2021-02-09 DIAGNOSIS — N920 Excessive and frequent menstruation with regular cycle: Secondary | ICD-10-CM | POA: Diagnosis not present

## 2021-02-13 ENCOUNTER — Other Ambulatory Visit: Payer: Self-pay

## 2021-02-13 ENCOUNTER — Encounter: Payer: Self-pay | Admitting: Family Medicine

## 2021-02-13 ENCOUNTER — Ambulatory Visit (INDEPENDENT_AMBULATORY_CARE_PROVIDER_SITE_OTHER): Payer: BC Managed Care – PPO | Admitting: Family Medicine

## 2021-02-13 VITALS — BP 127/78 | HR 89 | Temp 97.3°F | Resp 20 | Ht 63.0 in | Wt 189.0 lb

## 2021-02-13 DIAGNOSIS — R251 Tremor, unspecified: Secondary | ICD-10-CM

## 2021-02-13 DIAGNOSIS — S5001XA Contusion of right elbow, initial encounter: Secondary | ICD-10-CM | POA: Diagnosis not present

## 2021-02-13 DIAGNOSIS — R5382 Chronic fatigue, unspecified: Secondary | ICD-10-CM

## 2021-02-13 DIAGNOSIS — G3281 Cerebellar ataxia in diseases classified elsewhere: Secondary | ICD-10-CM

## 2021-02-13 DIAGNOSIS — B2701 Gammaherpesviral mononucleosis with polyneuropathy: Secondary | ICD-10-CM | POA: Diagnosis not present

## 2021-02-13 DIAGNOSIS — R12 Heartburn: Secondary | ICD-10-CM

## 2021-02-13 MED ORDER — DICLOFENAC SODIUM 75 MG PO TBEC: 75.0000 mg | DELAYED_RELEASE_TABLET | Freq: Two times a day (BID) | ORAL | 2 refills | Status: DC

## 2021-02-13 NOTE — Progress Notes (Signed)
Subjective:  Patient ID: Vanessa Harrington, female    DOB: 01/27/1976  Age: 45 y.o. MRN: [id number]  CC: Medical Management of Chronic Issues (2-3 mo from last visit with Mono-like S/S)   HPI AVIELLA DISBROW presents for continued chronic fatigue.  She also says she has an internal tremor that she feels is a vibrating sensation it is intermittent but there most of the time.  She says her legs are getting better but they are still very weak.  She has seen a holistic provider who is having her take various supplements.  These were noted at her previous visit.  Patient says that the holistic provider has her taking fungus and mold supplements to get rid of her bodies fungus.  Patient was recently here for elbow injury and took Voltaren.  She says that helped a lot.  Depression screen [facility] 2/9 02/13/2021 01/16/2021 11/07/2020  Decreased Interest 0 0 0  Down, Depressed, Hopeless 0 0 0  PHQ - 2 Score 0 0 0  Altered sleeping - - -  Tired, decreased energy - - -  Change in appetite - - -  Feeling bad or failure about yourself  - - -  Trouble concentrating - - -  Moving slowly or fidgety/restless - - -  Suicidal thoughts - - -  PHQ-9 Score - - -  Difficult doing work/chores - - -    History Dania has a past medical history of Anxiety, Blood pressure alteration, Depression, Exposure to bat without known bite (10/30/2019), Hypertension, Increased intracranial pressure, Pseudotumor cerebri, and Uterine fibroid.   She has a past surgical history that includes Cholecystectomy (2015); Cesarean section (1999); Mouth surgery (2005); and Tubal ligation (2001).   Her family history includes Heart Problems in her brother and father; Heart disease in her brother, brother, and father; Hypothyroidism in her mother.She reports that she quit smoking about 14 years ago. Her smoking use included cigarettes. She has never used smokeless tobacco. She reports that she does not drink alcohol and does not use  drugs.    ROS Review of Systems  Constitutional: Positive for fatigue. Negative for activity change, appetite change and fever.  HENT: Positive for dental problem (She says her teeth hurt.).   Eyes: Negative for visual disturbance.  Respiratory: Negative for shortness of breath.   Cardiovascular: Negative for chest pain.  Gastrointestinal: Negative for abdominal pain (With gurgling in her stomach intermittently.).  Musculoskeletal: Positive for arthralgias (Currently primarily in the elbows and knees.).  Neurological: Positive for tremors and weakness.    Objective:  BP 127/78   Pulse 89   Temp (!) 97.3 F (36.3 C)   Resp 20   Ht _0  (1.6 m)   Wt 189 lb (85.7 kg)   LMP 02/01/2021 (Approximate)   SpO2 99%   BMI 33.48 kg/m   BP Readings from Last 3 Encounters:  02/13/21 127/78  01/16/21 133/85  11/07/20 (!) 146/86    Wt Readings from Last 3 Encounters:  02/13/21 189 lb (85.7 kg)  01/16/21 185 lb 6.4 oz (84.1 kg)  11/07/20 175 lb 6.4 oz (79.6 kg)     Physical Exam Constitutional:      General: She is not in acute distress.    Appearance: She is well-developed.  HENT:     Head: Normocephalic and atraumatic.  Eyes:     Conjunctiva/sclera: Conjunctivae normal.     Pupils: Pupils are equal, round, and reactive to light.  Neck:     Thyroid:  No thyromegaly.  Cardiovascular:     Rate and Rhythm: Normal rate and regular rhythm.     Heart sounds: Normal heart sounds. No murmur heard.   Pulmonary:     Effort: Pulmonary effort is normal. No respiratory distress.     Breath sounds: Normal breath sounds. No wheezing or rales.  Abdominal:     General: Bowel sounds are normal. There is no distension.     Palpations: Abdomen is soft.     Tenderness: There is no abdominal tenderness.  Musculoskeletal:        General: Normal range of motion.     Cervical back: Normal range of motion and neck supple.  Lymphadenopathy:     Cervical: No cervical adenopathy.  Skin:     General: Skin is warm and dry.  Neurological:     Mental Status: She is alert and oriented to person, place, and time.  Psychiatric:        Behavior: Behavior normal.        Thought Content: Thought content normal.        Judgment: Judgment normal.       Assessment & Plan:   Lakechia was seen today for medical management of chronic issues.  Diagnoses and all orders for this visit:  Cerebellar ataxia in diseases classified elsewhere ([place]) -     CBC with Differential/Platelet -     CMP14+EGFR -     Haemophilius influenzae B Ab IgG  Contusion of right elbow, initial encounter -     CBC with Differential/Platelet -     CMP14+EGFR -     Haemophilius influenzae B Ab IgG  Gammaherpesviral mononucleosis with polyneuropathy -     CBC with Differential/Platelet -     CMP14+EGFR -     Haemophilius influenzae B Ab IgG  Chronic fatigue -     CBC with Differential/Platelet -     CMP14+EGFR -     Haemophilius influenzae B Ab IgG  Tremor of unknown origin -     CBC with Differential/Platelet -     CMP14+EGFR -     Haemophilius influenzae B Ab IgG  Heart burn -     CBC with Differential/Platelet -     CMP14+EGFR -     H. pylori antigen, stool -     Haemophilius influenzae B Ab IgG  Other orders -     diclofenac (VOLTAREN) 75 MG EC tablet; Take 1 tablet (75 mg total) by mouth 2 (two) times daily. For muscle and  Joint pain   Patient specifically requested the Haemophilus influenza testing.  She also asked for H. pylori testing.    I have discontinued Santa Genera. Pegues's itraconazole and diclofenac. I am also having her start on diclofenac. Additionally, I am having her maintain her vitamin C, Ferrous Gluconate-C-Folic Acid (IRON-C PO), Magnesium, OVER THE COUNTER MEDICATION, and UNABLE TO FIND.  Allergies as of 02/13/2021      Reactions   Diamox [acetazolamide] Rash   Due to sulfa allergy   Sulfa Antibiotics Rash      Medication List       Accurate as of February 13, 2021  6:10 PM. If you have any questions, ask your nurse or doctor.        STOP taking these medications   itraconazole 100 MG capsule Commonly known as: SPORANOX Stopped by: Claretta Fraise, MD     TAKE these medications   diclofenac 75 MG EC tablet Commonly known as: VOLTAREN Take  1 tablet (75 mg total) by mouth 2 (two) times daily. For muscle and  Joint pain What changed: additional instructions Changed by: Claretta Fraise, MD   IRON-C PO Take by mouth.   Magnesium 250 MG Tabs Take by mouth.   OVER THE COUNTER MEDICATION GI- DETOX  - cap = one a day   UNABLE TO FIND Med Name: Phil Campbell  - one a day   vitamin C 1000 MG tablet Take 1,000 mg by mouth daily.        Follow-up: Return in about 3 months (around 05/13/2021).  Claretta Fraise, M.D.

## 2021-02-14 LAB — CBC WITH DIFFERENTIAL/PLATELET
Basophils Absolute: 0.1 10*3/uL (ref 0.0–0.2)
Basos: 1 %
EOS (ABSOLUTE): 0.3 10*3/uL (ref 0.0–0.4)
Eos: 3 %
Hematocrit: 37.7 % (ref 34.0–46.6)
Hemoglobin: 12.4 g/dL (ref 11.1–15.9)
Immature Grans (Abs): 0 10*3/uL (ref 0.0–0.1)
Immature Granulocytes: 1 %
Lymphocytes Absolute: 1.6 10*3/uL (ref 0.7–3.1)
Lymphs: 19 %
MCH: 29.2 pg (ref 26.6–33.0)
MCHC: 32.9 g/dL (ref 31.5–35.7)
MCV: 89 fL (ref 79–97)
Monocytes Absolute: 0.9 10*3/uL (ref 0.1–0.9)
Monocytes: 11 %
Neutrophils Absolute: 5.5 10*3/uL (ref 1.4–7.0)
Neutrophils: 65 %
Platelets: 371 10*3/uL (ref 150–450)
RBC: 4.25 x10E6/uL (ref 3.77–5.28)
RDW: 14.1 % (ref 11.7–15.4)
WBC: 8.3 10*3/uL (ref 3.4–10.8)

## 2021-02-14 LAB — CMP14+EGFR
ALT: 18 IU/L (ref 0–32)
AST: 15 IU/L (ref 0–40)
Albumin/Globulin Ratio: 1.6 (ref 1.2–2.2)
Albumin: 4 g/dL (ref 3.8–4.8)
Alkaline Phosphatase: 100 IU/L (ref 44–121)
BUN/Creatinine Ratio: 17 (ref 9–23)
BUN: 11 mg/dL (ref 6–24)
Bilirubin Total: <0.2 mg/dL (ref 0.0–1.2)
CO2: 23 mmol/L (ref 20–29)
Calcium: 9.1 mg/dL (ref 8.7–10.2)
Chloride: 103 mmol/L (ref 96–106)
Creatinine, Ser: 0.64 mg/dL (ref 0.57–1.00)
GFR calc Af Amer: 126 mL/min/{1.73_m2} (ref >59)
GFR calc non Af Amer: 110 mL/min/{1.73_m2} (ref >59)
Globulin, Total: 2.5 g/dL (ref 1.5–4.5)
Glucose: 85 mg/dL (ref 65–99)
Potassium: 4.5 mmol/L (ref 3.5–5.2)
Sodium: 139 mmol/L (ref 134–144)
Total Protein: 6.5 g/dL (ref 6.0–8.5)

## 2021-02-14 LAB — HAEMOPHILIUS INFLUENZAE B AB IGG: Influenza B Virus Ab, IgG: <0.15 ug/mL

## 2021-02-15 ENCOUNTER — Other Ambulatory Visit: Payer: BC Managed Care – PPO

## 2021-02-15 ENCOUNTER — Other Ambulatory Visit: Payer: Self-pay

## 2021-02-15 DIAGNOSIS — R12 Heartburn: Secondary | ICD-10-CM | POA: Diagnosis not present

## 2021-02-15 NOTE — Progress Notes (Signed)
Hello Vanessa Harrington,  Your lab result is normal and/or stable.Some minor variations that are not significant are commonly marked abnormal, but do not represent any medical problem for you.  Best regards, Illana Nolting, M.D.

## 2021-02-17 LAB — H. PYLORI ANTIGEN, STOOL: H pylori Ag, Stl: NEGATIVE

## 2021-05-17 ENCOUNTER — Ambulatory Visit: Payer: BC Managed Care – PPO | Admitting: Family Medicine

## 2021-05-30 ENCOUNTER — Ambulatory Visit (INDEPENDENT_AMBULATORY_CARE_PROVIDER_SITE_OTHER): Payer: BC Managed Care – PPO

## 2021-05-30 ENCOUNTER — Ambulatory Visit (INDEPENDENT_AMBULATORY_CARE_PROVIDER_SITE_OTHER): Payer: BC Managed Care – PPO | Admitting: Nurse Practitioner

## 2021-05-30 ENCOUNTER — Ambulatory Visit (HOSPITAL_COMMUNITY)
Admission: RE | Admit: 2021-05-30 | Discharge: 2021-05-30 | Disposition: A | Discharge status: Home / Self Care | Payer: BC Managed Care – PPO | Source: Ambulatory Visit | Attending: Nurse Practitioner | Admitting: Nurse Practitioner

## 2021-05-30 ENCOUNTER — Other Ambulatory Visit: Payer: Self-pay

## 2021-05-30 ENCOUNTER — Other Ambulatory Visit: Payer: Self-pay | Admitting: Nurse Practitioner

## 2021-05-30 ENCOUNTER — Encounter: Payer: Self-pay | Admitting: Nurse Practitioner

## 2021-05-30 VITALS — BP 112/83 | HR 102 | Temp 96.9°F | Ht 63.0 in | Wt 199.0 lb

## 2021-05-30 DIAGNOSIS — R1031 Right lower quadrant pain: Secondary | ICD-10-CM

## 2021-05-30 DIAGNOSIS — R1030 Lower abdominal pain, unspecified: Secondary | ICD-10-CM

## 2021-05-30 DIAGNOSIS — K5730 Diverticulosis of large intestine without perforation or abscess without bleeding: Secondary | ICD-10-CM | POA: Diagnosis not present

## 2021-05-30 DIAGNOSIS — N858 Other specified noninflammatory disorders of uterus: Secondary | ICD-10-CM | POA: Diagnosis not present

## 2021-05-30 DIAGNOSIS — R109 Unspecified abdominal pain: Secondary | ICD-10-CM | POA: Diagnosis not present

## 2021-05-30 DIAGNOSIS — R1909 Other intra-abdominal and pelvic swelling, mass and lump: Secondary | ICD-10-CM | POA: Diagnosis not present

## 2021-05-30 DIAGNOSIS — R399 Unspecified symptoms and signs involving the genitourinary system: Secondary | ICD-10-CM | POA: Diagnosis not present

## 2021-05-30 DIAGNOSIS — I878 Other specified disorders of veins: Secondary | ICD-10-CM | POA: Diagnosis not present

## 2021-05-30 LAB — URINALYSIS, ROUTINE W REFLEX MICROSCOPIC
Bilirubin, UA: NEGATIVE
Glucose, UA: NEGATIVE
Ketones, UA: NEGATIVE
Nitrite, UA: NEGATIVE
Protein,UA: NEGATIVE
Specific Gravity, UA: 1.015 (ref 1.005–1.030)
Urobilinogen, Ur: 0.2 mg/dL (ref 0.2–1.0)
pH, UA: 7.5 (ref 5.0–7.5)

## 2021-05-30 LAB — MICROSCOPIC EXAMINATION

## 2021-05-30 LAB — PREGNANCY, URINE: Preg Test, Ur: NEGATIVE

## 2021-05-30 MED ORDER — IBUPROFEN 600 MG PO TABS: 600.0000 mg | ORAL_TABLET | Freq: Three times a day (TID) | ORAL | 0 refills | Status: DC | PRN

## 2021-05-30 MED ORDER — IOHEXOL 9 MG/ML PO SOLN
ORAL | Status: AC
  Filled 2021-05-30: qty 1000

## 2021-05-30 MED ORDER — CEFPODOXIME PROXETIL 200 MG PO TABS: 200.0000 mg | ORAL_TABLET | Freq: Two times a day (BID) | ORAL | 0 refills | Status: DC

## 2021-05-30 NOTE — Progress Notes (Signed)
Acute Office Visit  Subjective:    Patient ID: Vanessa Harrington, female    DOB: Sep 13, 1976, 45 y.o.   MRN: [id number]  Chief Complaint  Patient presents with  . Abdominal Pain  . Dysuria    Urinary Tract Infection  This is a new problem. The current episode started yesterday. The problem occurs every urination. The problem has been unchanged. The quality of the pain is described as aching. The pain is severe. There has been no fever. She is sexually active. Associated symptoms include flank pain. She has tried nothing for the symptoms.  Abdominal Pain This is a new problem. The current episode started yesterday. The onset quality is sudden. The problem occurs constantly. The problem has been gradually worsening. The pain is located in the RLQ. The pain is at a severity of 10/10. The quality of the pain is sharp. The abdominal pain radiates to the LLQ. Pertinent negatives include no constipation. The pain is aggravated by palpation. The pain is relieved by nothing. She has tried nothing for the symptoms.     Past Medical History:  Diagnosis Date  . Anxiety   . Blood pressure alteration    "blood pressure goes up and down"  . Depression   . Exposure to bat without known bite 10/30/2019  . Hypertension   . Increased intracranial pressure   . Pseudotumor cerebri   . Uterine fibroid     Past Surgical History:  Procedure Laterality Date  . CESAREAN SECTION  1999  . CHOLECYSTECTOMY  2015  . MOUTH SURGERY  2005  . TUBAL LIGATION  2001    Family History  Problem Relation Age of Onset  . Heart Problems Father        pacemaker  . Heart disease Father   . Heart Problems Brother        pacemaker  . Heart disease Brother   . Hypothyroidism Mother   . Heart disease Brother   . Pseudotumor cerebri Neg Hx     Social History   Socioeconomic History  . Marital status: Married    Spouse name: Not on file  . Number of children: 3  . Years of education: 12  . Highest  education level: Associate degree: academic program  Occupational History  . Not on file  Tobacco Use  . Smoking status: Former Smoker    Types: Cigarettes    Quit date: 2008    Years since quitting: 14.4  . Smokeless tobacco: Never Used  Vaping Use  . Vaping Use: Never used  Substance and Sexual Activity  . Alcohol use: Never  . Drug use: Never  . Sexual activity: Yes    Birth control/protection: Surgical  Other Topics Concern  . Not on file  Social History Narrative   Lives at home with husband & kids   Right handed   Caffeine: 3 cans/day; update 01/25/2020 stopped drinking caffeine because of the tremors   Social Determinants of Health   Financial Resource Strain: Not on file  Food Insecurity: Not on file  Transportation Needs: Not on file  Physical Activity: Not on file  Stress: Not on file  Social Connections: Not on file  Intimate Partner Violence: Not on file    Outpatient Medications Prior to Visit  Medication Sig Dispense Refill  . Ascorbic Acid (VITAMIN C) 1000 MG tablet Take 1,000 mg by mouth daily.    . Ferrous Gluconate-C-Folic Acid (IRON-C PO) Take by mouth.    . Magnesium 250 MG  TABS Take by mouth.    Marland Kitchen OVER THE COUNTER MEDICATION GI- DETOX  - cap = one a day    . diclofenac (VOLTAREN) 75 MG EC tablet Take 1 tablet (75 mg total) by mouth 2 (two) times daily. For muscle and  Joint pain (Patient not taking: Reported on 05/30/2021) 60 tablet 2  . UNABLE TO FIND Med Name: HEPATOTHERA OTC SUPPLEMENT  - one a day (Patient not taking: Reported on 05/30/2021)     No facility-administered medications prior to visit.    Allergies  Allergen Reactions  . Diamox [Acetazolamide] Rash    Due to sulfa allergy  . Sulfa Antibiotics Rash    Review of Systems  Constitutional: Negative.   HENT: Negative.   Gastrointestinal: Positive for abdominal pain. Negative for constipation.  Genitourinary: Positive for flank pain.  All other systems reviewed and are negative.       Objective:    Physical Exam Vitals and nursing note reviewed. Exam conducted with a chaperone present (spouse).  Constitutional:      Appearance: Normal appearance. She is normal weight.  HENT:     Head: Normocephalic.     Nose: Nose normal.  Eyes:     Conjunctiva/sclera: Conjunctivae normal.  Cardiovascular:     Rate and Rhythm: Normal rate.  Pulmonary:     Effort: Pulmonary effort is normal.     Breath sounds: Normal breath sounds.  Abdominal:     General: Bowel sounds are normal.     Palpations: Abdomen is soft.     Tenderness: There is abdominal tenderness in the right lower quadrant. There is right CVA tenderness. Positive signs include McBurney's sign.  Neurological:     Mental Status: She is alert.     BP 112/83   Pulse (!) 102   Temp (!) 96.9 F (36.1 C) (Temporal)   Ht 5\' 3"  (1.6 m)   Wt 199 lb (90.3 kg)   SpO2 100%   BMI 35.25 kg/m  Wt Readings from Last 3 Encounters:  05/30/21 199 lb (90.3 kg)  02/13/21 189 lb (85.7 kg)  01/16/21 185 lb 6.4 oz (84.1 kg)    Health Maintenance Due  Topic Date Due  . COVID-19 Vaccine (1) Never done    There are no preventive care reminders to display for this patient.   Lab Results  Component Value Date   TSH 0.680 11/29/2019   Lab Results  Component Value Date   WBC 8.3 02/13/2021   HGB 12.4 02/13/2021   HCT 37.7 02/13/2021   MCV 89 02/13/2021   PLT 371 02/13/2021   Lab Results  Component Value Date   NA 139 02/13/2021   K 4.5 02/13/2021   CO2 23 02/13/2021   GLUCOSE 85 02/13/2021   BUN 11 02/13/2021   CREATININE 0.64 02/13/2021   BILITOT <0.2 02/13/2021   ALKPHOS 100 02/13/2021   AST 15 02/13/2021   ALT 18 02/13/2021   PROT 6.5 02/13/2021   ALBUMIN 4.0 02/13/2021   CALCIUM 9.1 02/13/2021   ANIONGAP 9 12/12/2019   Lab Results  Component Value Date   CHOL 157 11/08/2020   Lab Results  Component Value Date   HDL 49 11/08/2020   Lab Results  Component Value Date   LDLCALC 94 11/08/2020    Lab Results  Component Value Date   TRIG 72 11/08/2020   Lab Results  Component Value Date   CHOLHDL 3.2 11/08/2020   Lab Results  Component Value Date   HGBA1C 5.4  11/29/2019       Assessment & Plan:   Problem List Items Addressed This Visit      Other   UTI symptoms    New UTI symptoms with worsening pelvic pressure in the last 24 hours.  Completed urinalysis urine positive for leukocytes and few bacteria.  Urine sent to culture, results pending.  Started patient on Cefpodoxime 200 mg tablet by mouth twice daily. Increase hydration, ibuprofen/Tylenol for pain and discomfort.  Education provided to patient with printed handouts given.  Rx sent to pharmacy.        Relevant Medications   cefpodoxime (VANTIN) 200 MG tablet   Lower abdominal pain - Primary    Sudden right lower quadrant abdominal pain in the last 24 hours.  McBurney's point positive, pain radiates to the left lower quadrant.  Right CVA tenderness. X-ray KUB completed results pending, CT pelvis completed results pending.  Education provided to patient with printed handouts given.  Rx sent to pharmacy.  Follow-up with worsening or unresolved symptoms.      Relevant Medications   ibuprofen (ADVIL) 600 MG tablet   Other Relevant Orders   Urinalysis, Routine w reflex microscopic   DG Abd 1 View   Pregnancy, urine   Urine Culture   CBC with Differential   Comprehensive metabolic panel   CT Abdomen Pelvis W Contrast    Other Visit Diagnoses    Right lower quadrant abdominal pain           Meds ordered this encounter  Medications  . cefpodoxime (VANTIN) 200 MG tablet    Sig: Take 1 tablet (200 mg total) by mouth 2 (two) times daily.    Dispense:  20 tablet    Refill:  0    Order Specific Question:   Supervising Provider    Answer:   Janora Norlander [[id number]]  . ibuprofen (ADVIL) 600 MG tablet    Sig: Take 1 tablet (600 mg total) by mouth every 8 (eight) hours as needed.    Dispense:  30 tablet     Refill:  0    Order Specific Question:   Supervising Provider    Answer:   Janora Norlander [[id number]]     Ivy Lynn, NP

## 2021-05-30 NOTE — Assessment & Plan Note (Signed)
Sudden right lower quadrant abdominal pain in the last 24 hours.  McBurney's point positive, pain radiates to the left lower quadrant.  Right CVA tenderness. X-ray KUB completed results pending, CT pelvis completed results pending.  Education provided to patient with printed handouts given.  Rx sent to pharmacy.  Follow-up with worsening or unresolved symptoms.

## 2021-05-30 NOTE — Patient Instructions (Signed)

## 2021-05-30 NOTE — Assessment & Plan Note (Signed)
New UTI symptoms with worsening pelvic pressure in the last 24 hours.  Completed urinalysis urine positive for leukocytes and few bacteria.  Urine sent to culture, results pending.  Started patient on Cefpodoxime 200 mg tablet by mouth twice daily. Increase hydration, ibuprofen/Tylenol for pain and discomfort.  Education provided to patient with printed handouts given.  Rx sent to pharmacy.

## 2021-05-31 LAB — CBC WITH DIFFERENTIAL/PLATELET
Basophils Absolute: 0 10*3/uL (ref 0.0–0.2)
Basos: 0 %
EOS (ABSOLUTE): 0.1 10*3/uL (ref 0.0–0.4)
Eos: 1 %
Hematocrit: 39.5 % (ref 34.0–46.6)
Hemoglobin: 13.2 g/dL (ref 11.1–15.9)
Immature Grans (Abs): 0 10*3/uL (ref 0.0–0.1)
Immature Granulocytes: 0 %
Lymphocytes Absolute: 1.4 10*3/uL (ref 0.7–3.1)
Lymphs: 11 %
MCH: 29.9 pg (ref 26.6–33.0)
MCHC: 33.4 g/dL (ref 31.5–35.7)
MCV: 90 fL (ref 79–97)
Monocytes Absolute: 1.4 10*3/uL — ABNORMAL HIGH (ref 0.1–0.9)
Monocytes: 11 %
Neutrophils Absolute: 9.7 10*3/uL — ABNORMAL HIGH (ref 1.4–7.0)
Neutrophils: 77 %
Platelets: 338 10*3/uL (ref 150–450)
RBC: 4.41 x10E6/uL (ref 3.77–5.28)
RDW: 12.9 % (ref 11.7–15.4)
WBC: 12.6 10*3/uL — ABNORMAL HIGH (ref 3.4–10.8)

## 2021-05-31 LAB — COMPREHENSIVE METABOLIC PANEL
ALT: 21 IU/L (ref 0–32)
AST: 37 IU/L (ref 0–40)
Albumin/Globulin Ratio: 1.9 (ref 1.2–2.2)
Albumin: 4.4 g/dL (ref 3.8–4.8)
Alkaline Phosphatase: 111 IU/L (ref 44–121)
BUN/Creatinine Ratio: 13 (ref 9–23)
BUN: 10 mg/dL (ref 6–24)
Bilirubin Total: 0.6 mg/dL (ref 0.0–1.2)
CO2: 21 mmol/L (ref 20–29)
Calcium: 8.8 mg/dL (ref 8.7–10.2)
Chloride: 104 mmol/L (ref 96–106)
Creatinine, Ser: 0.79 mg/dL (ref 0.57–1.00)
Globulin, Total: 2.3 g/dL (ref 1.5–4.5)
Glucose: 96 mg/dL (ref 65–99)
Potassium: 4.6 mmol/L (ref 3.5–5.2)
Sodium: 139 mmol/L (ref 134–144)
Total Protein: 6.7 g/dL (ref 6.0–8.5)
eGFR: 95 mL/min/{1.73_m2} (ref >59)

## 2021-05-31 NOTE — Progress Notes (Signed)
Patient notified of results.

## 2021-06-04 LAB — URINE CULTURE

## 2021-06-07 ENCOUNTER — Other Ambulatory Visit: Payer: Self-pay | Admitting: Nurse Practitioner

## 2021-06-07 DIAGNOSIS — R1030 Lower abdominal pain, unspecified: Secondary | ICD-10-CM

## 2021-06-12 ENCOUNTER — Telehealth: Payer: Self-pay | Admitting: Family Medicine

## 2021-06-12 NOTE — Telephone Encounter (Signed)
Pt calling to check on referral to [facility]

## 2021-06-13 NOTE — Telephone Encounter (Signed)
Patient wants to wait and see if symptoms subside.  Will call us back for appt if this doesn't get better.

## 2021-06-13 NOTE — Telephone Encounter (Signed)
Pt. Needs to be seen for this. Thanks, WS 

## 2021-07-09 ENCOUNTER — Telehealth (INDEPENDENT_AMBULATORY_CARE_PROVIDER_SITE_OTHER): Payer: Self-pay | Admitting: Family Medicine

## 2021-07-09 ENCOUNTER — Encounter: Payer: Self-pay | Admitting: Family Medicine

## 2021-07-09 DIAGNOSIS — U071 COVID-19: Secondary | ICD-10-CM

## 2021-07-09 MED ORDER — AMOXICILLIN-POT CLAVULANATE 875-125 MG PO TABS: 1.0000 | ORAL_TABLET | Freq: Two times a day (BID) | ORAL | 0 refills | Status: AC

## 2021-07-09 NOTE — Progress Notes (Signed)
   Virtual Visit via video Note   Due to COVID-19 pandemic this visit was conducted virtually. This visit type was conducted due to national recommendations for restrictions regarding the COVID-19 Pandemic (e.g. social distancing, sheltering in place) in an effort to limit this patient's exposure and mitigate transmission in our community. All issues noted in this document were discussed and addressed.  A physical exam was not performed with this format.  I connected with  Vanessa Harrington  on 07/09/21 at 1535 by video and verified that I am speaking with the correct person using two identifiers. Vanessa Harrington is currently located at home and no one is currently with her during the visit. The provider, Gwenlyn Perking, FNP is located in their office at time of visit.  I discussed the limitations, risks, security and privacy concerns of performing an evaluation and management service by video  and the availability of in person appointments. I also discussed with the patient that there may be a patient responsible charge related to this service. The patient expressed understanding and agreed to proceed.  CC: Covid 19  History and Present Illness:  Vanessa Harrington tested positive for Covid on 07/04/21. Her symptoms started on 06/30/21. She initially had a fever, but this has since resolved. She continues to have chills, sweating, sore throat, body aches, diarrhea, loss of taste and smell, cough, and congestion. She has been taking mucinex, vitamin D, zinc, and vitamin D without improvement. Other than the resolution of her fever, she feels like her symptoms have not improved. She denies chest pain, shortness of breath, nausea, or vomiting.    ROS As per HPI.     Observations/Objective: Alert and oriented x 3. Able to speak in full sentences without difficulty. Respirations unlabored. No angioedema or cyanosis present. Non-toxic appearing.   Assessment and Plan: Vanessa Harrington was seen today for covid  positive.  Diagnoses and all orders for this visit:  COVID-19 With symptoms x 10 days. Augmentin given to cover for possible secondary bacterial infection. Continue mucinex. She has some left over prednisone at home that is not out of date. Discussed that she may take 20 mg daily for 5 days. Rest, hydration. Quarantine discussed as well as return precautions and when to seek emergency care.  -     amoxicillin-clavulanate (AUGMENTIN) 875-125 MG tablet; Take 1 tablet by mouth 2 (two) times daily for 7 days.    Follow Up Instructions: Return to office for new or worsening symptoms, or if symptoms persist.     I discussed the assessment and treatment plan with the patient. The patient was provided an opportunity to ask questions and all were answered. The patient agreed with the plan and demonstrated an understanding of the instructions.   The patient was advised to call back or seek an in-person evaluation if the symptoms worsen or if the condition fails to improve as anticipated.  The above assessment and management plan was discussed with the patient. The patient verbalized understanding of and has agreed to the management plan. Patient is aware to call the clinic if symptoms persist or worsen. Patient is aware when to return to the clinic for a follow-up visit. Patient educated on when it is appropriate to go to the emergency department.   Time call ended:  1545  I provided 10 minutes of face-to-face time during this encounter.    Gwenlyn Perking, FNP

## 2021-08-25 ENCOUNTER — Telehealth: Payer: Self-pay | Admitting: Radiology

## 2021-08-25 NOTE — Telephone Encounter (Signed)
Called and left voicemail to call [facility] to schedule [facility] from referral. Office number provided.

## 2022-02-07 ENCOUNTER — Encounter: Payer: Self-pay | Admitting: Family Medicine

## 2022-02-07 ENCOUNTER — Ambulatory Visit (INDEPENDENT_AMBULATORY_CARE_PROVIDER_SITE_OTHER): Payer: 59 | Admitting: Family Medicine

## 2022-02-07 VITALS — BP 139/89 | HR 97 | Temp 98.3°F | Ht 63.0 in | Wt 201.0 lb

## 2022-02-07 DIAGNOSIS — J069 Acute upper respiratory infection, unspecified: Secondary | ICD-10-CM | POA: Diagnosis not present

## 2022-02-07 DIAGNOSIS — R059 Cough, unspecified: Secondary | ICD-10-CM

## 2022-02-07 LAB — VERITOR FLU A/B WAIVED
Influenza A: NEGATIVE
Influenza B: NEGATIVE

## 2022-02-07 MED ORDER — PREDNISONE 20 MG PO TABS: 40.0000 mg | ORAL_TABLET | Freq: Every day | ORAL | 0 refills | Status: AC

## 2022-02-07 MED ORDER — ALBUTEROL SULFATE HFA 108 (90 BASE) MCG/ACT IN AERS: 2.0000 | INHALATION_SPRAY | Freq: Four times a day (QID) | RESPIRATORY_TRACT | 0 refills | Status: AC | PRN

## 2022-02-07 MED ORDER — FLUTICASONE PROPIONATE 50 MCG/ACT NA SUSP: 2.0000 | Freq: Every day | NASAL | 6 refills | Status: AC

## 2022-02-07 NOTE — Progress Notes (Addendum)
Subjective:  Patient ID: Vanessa Harrington, female    DOB: 1976/05/27, 46 y.o.   MRN: [id number]  Patient Care Team: Claretta Fraise, MD as PCP - General (Family Medicine)   Chief Complaint:  Cough   HPI: Vanessa Harrington is a 46 y.o. female presenting on 02/07/2022 for Cough   Cough This is a new problem. The current episode started in the past 7 days. The problem has been gradually worsening. The problem occurs every few minutes. The cough is Non-productive. Associated symptoms include shortness of breath and wheezing. Pertinent negatives include no chest pain, chills, fever, headaches or sweats. The symptoms are aggravated by lying down. Treatments tried: Mucinex. Her past medical history is significant for pneumonia (20+ years ago). There is no history of asthma.     Relevant past medical, surgical, family, and social history reviewed and updated as indicated.  Allergies and medications reviewed and updated. Data reviewed: Chart in Epic.   Past Medical History:  Diagnosis Date   Anxiety    Blood pressure alteration    "blood pressure goes up and down"   Depression    Exposure to bat without known bite 10/30/2019   Hypertension    Increased intracranial pressure    Pseudotumor cerebri    Uterine fibroid     Past Surgical History:  Procedure Laterality Date   [place]  2015   MOUTH SURGERY  2005   TUBAL LIGATION  2001    Social History   Socioeconomic History   Marital status: Married    Spouse name: Not on file   Number of children: 3   Years of education: 14   Highest education level: Associate degree: academic program  Occupational History   Not on file  Tobacco Use   Smoking status: Former    Types: Cigarettes    Quit date: 2008    Years since quitting: 15.1   Smokeless tobacco: Never  Vaping Use   Vaping Use: Never used  Substance and Sexual Activity   Alcohol use: Never   Drug use: Never   Sexual activity:  Yes    Birth control/protection: Surgical  Other Topics Concern   Not on file  Social History Narrative   Lives at home with husband & kids   Right handed   Caffeine: 3 cans/day; update 01/25/2020 stopped drinking caffeine because of the tremors   Social Determinants of Health   Financial Resource Strain: Not on file  Food Insecurity: Not on file  Transportation Needs: Not on file  Physical Activity: Not on file  Stress: Not on file  Social Connections: Not on file  Intimate Partner Violence: Not on file    Outpatient Encounter Medications as of 02/07/2022  Medication Sig   albuterol (VENTOLIN HFA) 108 (90 Base) MCG/ACT inhaler Inhale 2 puffs into the lungs every 6 (six) hours as needed for wheezing or shortness of breath.   Ascorbic Acid (VITAMIN C) 1000 MG tablet Take 1,000 mg by mouth daily.   diclofenac (VOLTAREN) 75 MG EC tablet Take 1 tablet (75 mg total) by mouth 2 (two) times daily. For muscle and  Joint pain   fluticasone (FLONASE) 50 MCG/ACT nasal spray Place 2 sprays into both nostrils daily.   Magnesium 250 MG TABS Take by mouth.   predniSONE (DELTASONE) 20 MG tablet Take 2 tablets (40 mg total) by mouth daily with breakfast for 5 days.   [DISCONTINUED] Ferrous Gluconate-C-Folic Acid (IRON-C PO) Take  by mouth.   [DISCONTINUED] ibuprofen (ADVIL) 600 MG tablet Take 1 tablet (600 mg total) by mouth every 8 (eight) hours as needed.   [DISCONTINUED] OVER THE COUNTER MEDICATION GI- DETOX  - cap = one a day   [DISCONTINUED] UNABLE TO FIND Med Name: HEPATOTHERA OTC SUPPLEMENT  - one a day (Patient not taking: Reported on 05/30/2021)   No facility-administered encounter medications on file as of 02/07/2022.    Allergies  Allergen Reactions   Diamox [Acetazolamide] Rash    Due to sulfa allergy   Sulfa Antibiotics Rash    Review of Systems  Constitutional:  Negative for activity change, appetite change, chills, diaphoresis, fatigue, fever and unexpected weight change.   Respiratory:  Positive for cough, shortness of breath and wheezing. Negative for apnea, choking, chest tightness and stridor.   Cardiovascular:  Negative for chest pain, palpitations and leg swelling.  Genitourinary:  Negative for decreased urine volume.  Neurological:  Negative for weakness and headaches.  Psychiatric/Behavioral:  Negative for confusion.   All other systems reviewed and are negative.      Objective:  BP 139/89    Pulse 97    Temp 98.3 F (36.8 C) (Temporal)    Ht _0  (1.6 m)    Wt 91.2 kg    SpO2 96%    BMI 35.61 kg/m    Wt Readings from Last 3 Encounters:  02/07/22 91.2 kg  05/30/21 90.3 kg  02/13/21 85.7 kg    Physical Exam Vitals and nursing note reviewed.  Constitutional:      Appearance: Normal appearance. She is obese.  HENT:     Head: Normocephalic and atraumatic.     Mouth/Throat:     Mouth: Mucous membranes are moist.  Eyes:     Conjunctiva/sclera: Conjunctivae normal.     Pupils: Pupils are equal, round, and reactive to light.  Cardiovascular:     Rate and Rhythm: Normal rate and regular rhythm.     Heart sounds: Normal heart sounds. No murmur heard.   No friction rub. No gallop.  Pulmonary:     Effort: Pulmonary effort is normal. No respiratory distress.     Breath sounds: Wheezing present. No rhonchi.  Skin:    General: Skin is warm and dry.     Capillary Refill: Capillary refill takes less than 2 seconds.  Neurological:     General: No focal deficit present.     Mental Status: She is alert and oriented to person, place, and time.  Psychiatric:        Mood and Affect: Mood normal.        Behavior: Behavior normal.        Thought Content: Thought content normal.        Judgment: Judgment normal.    Results for orders placed or performed in visit on 05/30/21  Urine Culture   Specimen: Urine   Urine  Result Value Ref Range   Urine Culture, Routine Final report    Organism ID, Bacteria Lactobacillus species    ORGANISM ID,  BACTERIA Comment   Microscopic Examination   Urine  Result Value Ref Range   WBC, UA 0-5 0 - 5 /hpf   RBC 0-2 0 - 2 /hpf   Epithelial Cells (non renal) 0-10 0 - 10 /hpf   Bacteria, UA Few None seen/Few  Urinalysis, Routine w reflex microscopic  Result Value Ref Range   Specific Gravity, UA 1.015 1.005 - 1.030   pH, UA 7.5 5.0 -  7.5   Color, UA Yellow Yellow   Appearance Ur Clear Clear   Leukocytes,UA 1+ (A) Negative   Protein,UA Negative Negative/Trace   Glucose, UA Negative Negative   Ketones, UA Negative Negative   RBC, UA 1+ (A) Negative   Bilirubin, UA Negative Negative   Urobilinogen, Ur 0.2 0.2 - 1.0 mg/dL   Nitrite, UA Negative Negative   Microscopic Examination See below:   Pregnancy, urine  Result Value Ref Range   Preg Test, Ur Negative Negative  CBC with Differential  Result Value Ref Range   WBC 12.6 (H) 3.4 - 10.8 x10E3/uL   RBC 4.41 3.77 - 5.28 x10E6/uL   Hemoglobin 13.2 11.1 - 15.9 g/dL   Hematocrit 39.5 34.0 - 46.6 %   MCV 90 79 - 97 fL   MCH 29.9 26.6 - 33.0 pg   MCHC 33.4 31.5 - 35.7 g/dL   RDW 12.9 11.7 - 15.4 %   Platelets 338 150 - 450 x10E3/uL   Neutrophils 77 Not Estab. %   Lymphs 11 Not Estab. %   Monocytes 11 Not Estab. %   Eos 1 Not Estab. %   Basos 0 Not Estab. %   Neutrophils Absolute 9.7 (H) 1.4 - 7.0 x10E3/uL   Lymphocytes Absolute 1.4 0.7 - 3.1 x10E3/uL   Monocytes Absolute 1.4 (H) 0.1 - 0.9 x10E3/uL   EOS (ABSOLUTE) 0.1 0.0 - 0.4 x10E3/uL   Basophils Absolute 0.0 0.0 - 0.2 x10E3/uL   Immature Granulocytes 0 Not Estab. %   Immature Grans (Abs) 0.0 0.0 - 0.1 x10E3/uL  Comprehensive metabolic panel  Result Value Ref Range   Glucose 96 65 - 99 mg/dL   BUN 10 6 - 24 mg/dL   Creatinine, Ser 0.79 0.57 - 1.00 mg/dL   eGFR 95 >59 mL/min/1.73   BUN/Creatinine Ratio 13 9 - 23   Sodium 139 134 - 144 mmol/L   Potassium 4.6 3.5 - 5.2 mmol/L   Chloride 104 96 - 106 mmol/L   CO2 21 20 - 29 mmol/L   Calcium 8.8 8.7 - 10.2 mg/dL   Total  Protein 6.7 6.0 - 8.5 g/dL   Albumin 4.4 3.8 - 4.8 g/dL   Globulin, Total 2.3 1.5 - 4.5 g/dL   Albumin/Globulin Ratio 1.9 1.2 - 2.2   Bilirubin Total 0.6 0.0 - 1.2 mg/dL   Alkaline Phosphatase 111 44 - 121 IU/L   AST 37 0 - 40 IU/L   ALT 21 0 - 32 IU/L       Pertinent labs & imaging results that were available during my care of the patient were reviewed by me and considered in my medical decision making.  Assessment & Plan:  Diann was seen today for cough.  Diagnoses and all orders for this visit:  Gladis was seen today for cough.  Diagnoses and all orders for this visit:  Cough in adult -     Veritor Flu A/B Waived -     albuterol (VENTOLIN HFA) 108 (90 Base) MCG/ACT inhaler; Inhale 2 puffs into the lungs every 6 (six) hours as needed for wheezing or shortness of breath. -     predniSONE (DELTASONE) 20 MG tablet; Take 2 tablets (40 mg total) by mouth daily with breakfast for 5 days. -     fluticasone (FLONASE) 50 MCG/ACT nasal spray; Place 2 sprays into both nostrils daily.  Upper respiratory virus -     albuterol (VENTOLIN HFA) 108 (90 Base) MCG/ACT inhaler; Inhale 2 puffs into the lungs every  6 (six) hours as needed for wheezing or shortness of breath. -     predniSONE (DELTASONE) 20 MG tablet; Take 2 tablets (40 mg total) by mouth daily with breakfast for 5 days. -     fluticasone (FLONASE) 50 MCG/ACT nasal spray; Place 2 sprays into both nostrils daily.  - Assessment and history negative for bacterial origin. Multiple negative covid home tests. Flu negative in office today. Discussed symptomatic care with albuterol, steroid burst, Flonase and mucinex.  Patient to continue home supply of mucinex. Discussed increased hydration.     Continue all other maintenance medications.  Follow up plan: Return if symptoms worsen or fail to improve.   Continue healthy lifestyle choices, including diet (rich in fruits, vegetables, and lean proteins, and low in salt and simple  carbohydrates) and exercise (at least 30 minutes of moderate physical activity daily).   The above assessment and management plan was discussed with the patient. The patient verbalized understanding of and has agreed to the management plan. Patient is aware to call the clinic if they develop any new symptoms or if symptoms persist or worsen. Patient is aware when to return to the clinic for a follow-up visit. Patient educated on when it is appropriate to go to the emergency department.   Addison Latina Frank, NP-S  I personally was present during the history, physical exam, and medical decision-making activities of this visit and have verified that the services and findings are accurately documented in the nurse practitioner student's note.  Monia Pouch, FNP-C [facility] [address] [place], [place] [address] [phone]

## 2022-02-08 IMAGING — CT CT ABD-PELV W/O CM
2 of 4 series · 17 of 46 positions shown, 19 images · non-contrast
Comparison: None

CLINICAL DATA: RIGHT lower quadrant pain since yesterday, positive
at McBurney's point, suspected appendicitis

EXAM:
CT ABDOMEN AND PELVIS WITHOUT CONTRAST
TECHNIQUE: Multidetector CT imaging of the abdomen and pelvis was performed
following the standard protocol without IV contrast. Patient refused
IV contrast. Patient drank dilute oral contrast for exam

[Series 2: axial st · axial · 0.88mm/px · z∈[+938,+1352]mm · 14 of 93 slices shown, 16 images]
[im 5/93  soft-tissue]
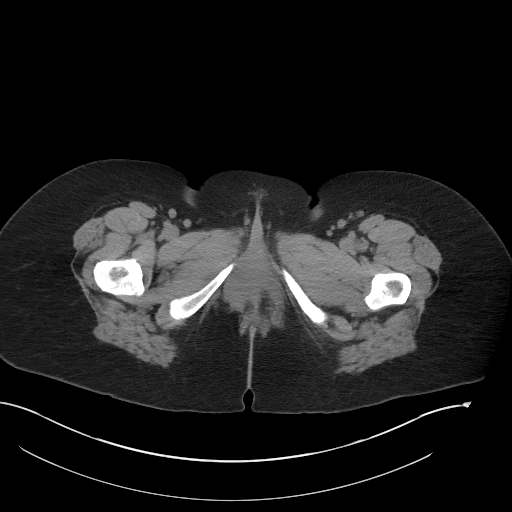
[im 5/93  bone]
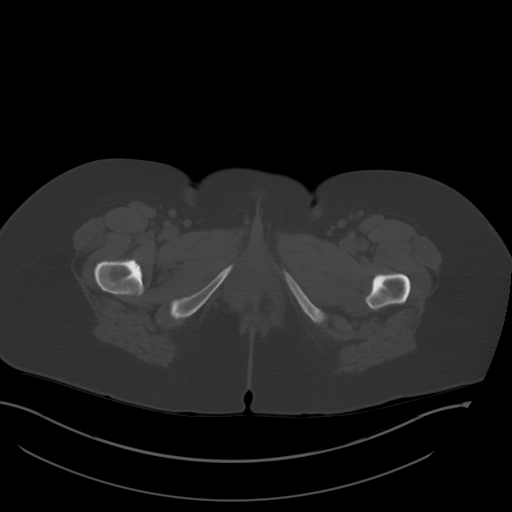
[im 10/93  soft-tissue]
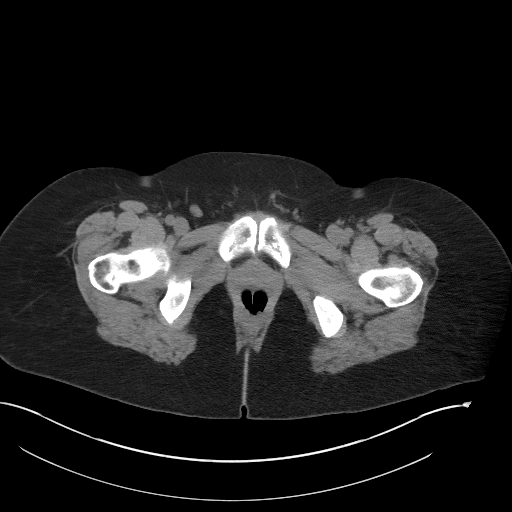
[im 20/93  soft-tissue]
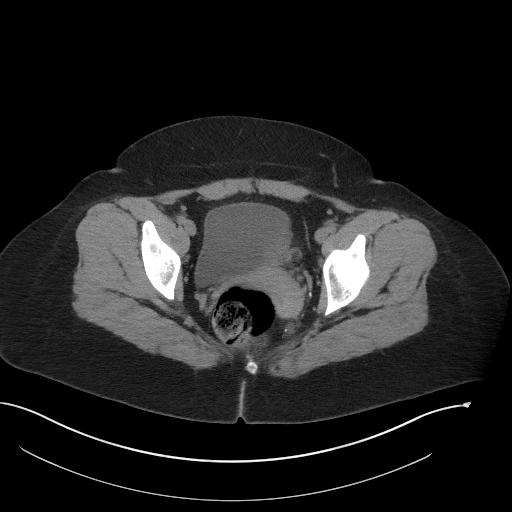
[im 25/93  soft-tissue]
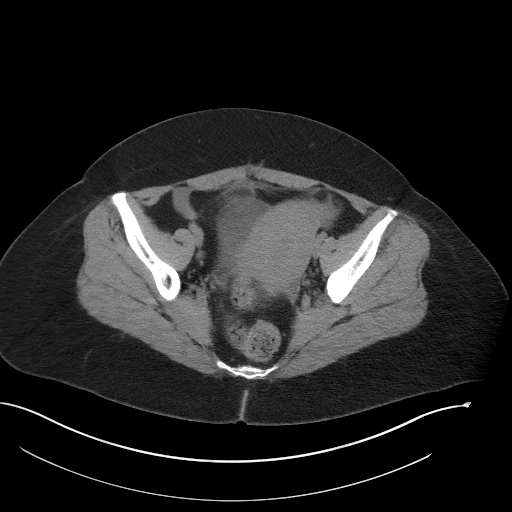
[im 30/93  soft-tissue]
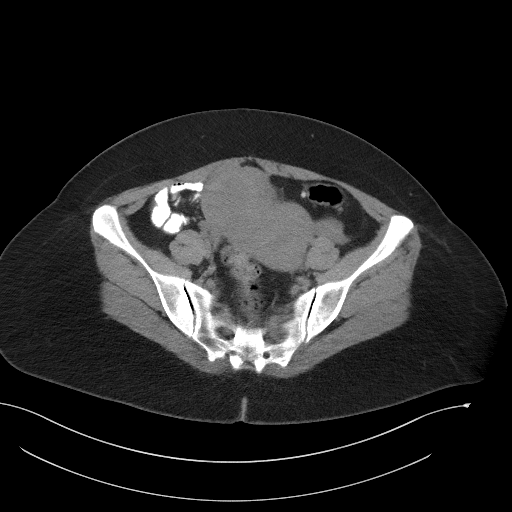
[im 39/93  soft-tissue]
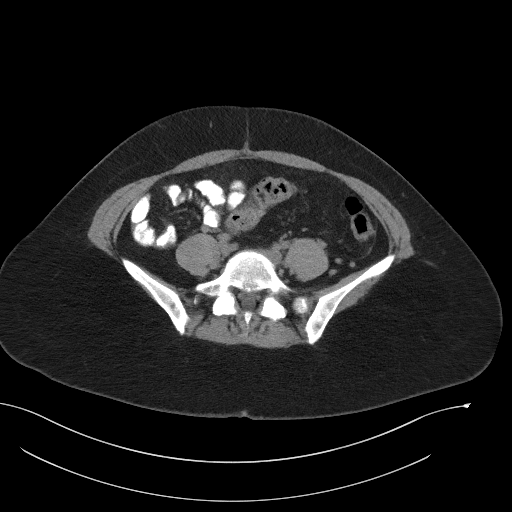
[im 44/93  soft-tissue]
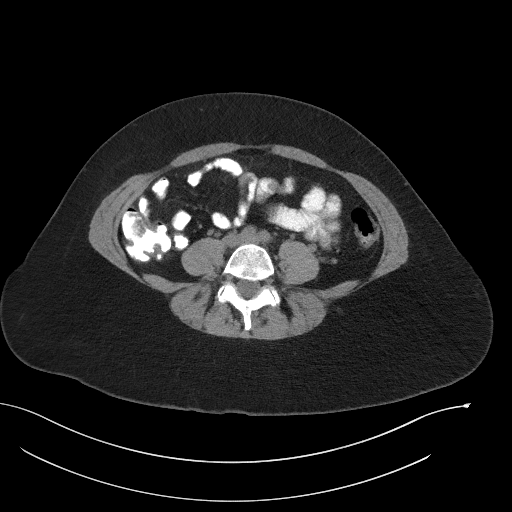
[im 49/93  soft-tissue]
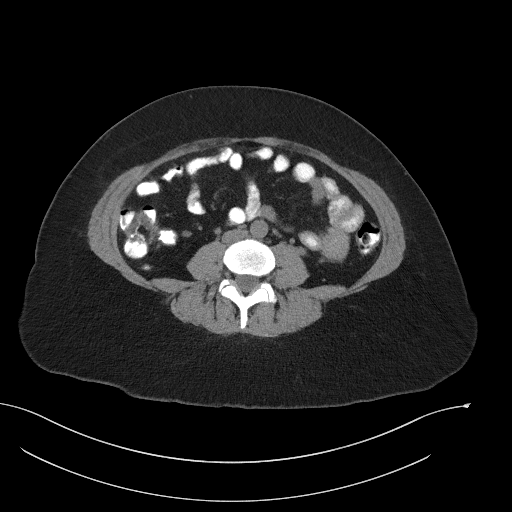
[im 54/93  soft-tissue]
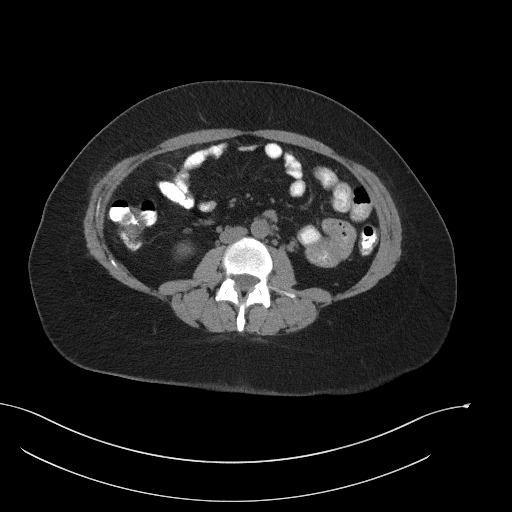
[im 54/93  bone]
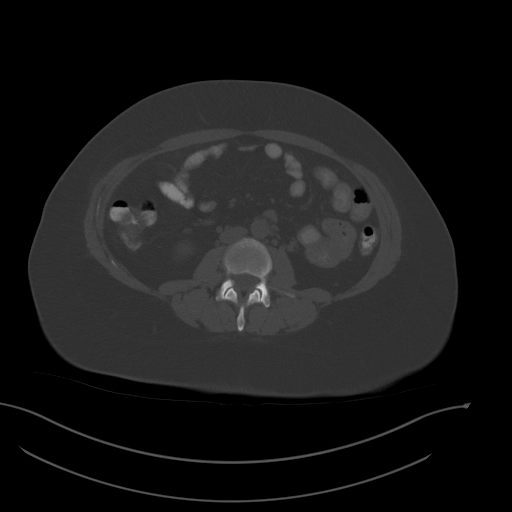
[im 63/93  soft-tissue]
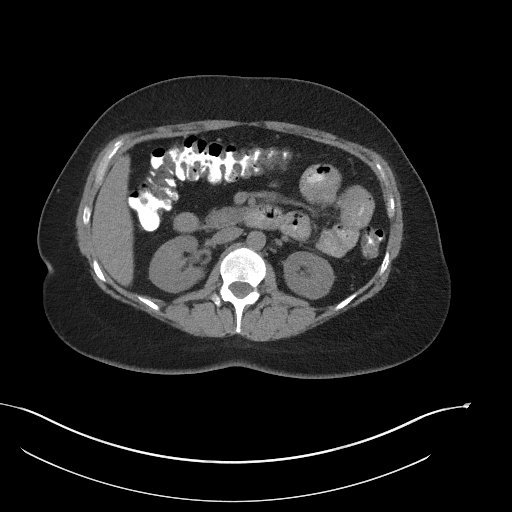
[im 68/93  soft-tissue]
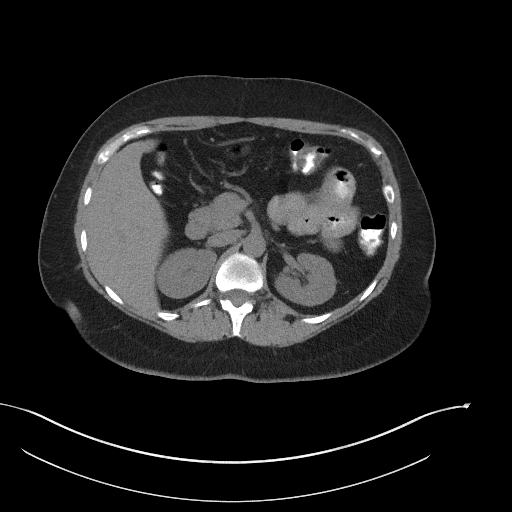
[im 73/93  soft-tissue]
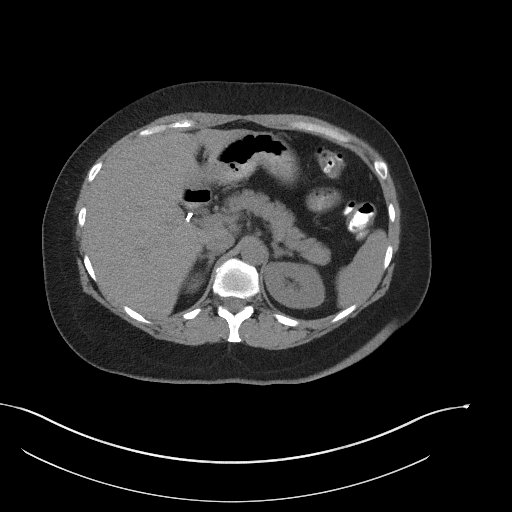
[im 83/93  soft-tissue]
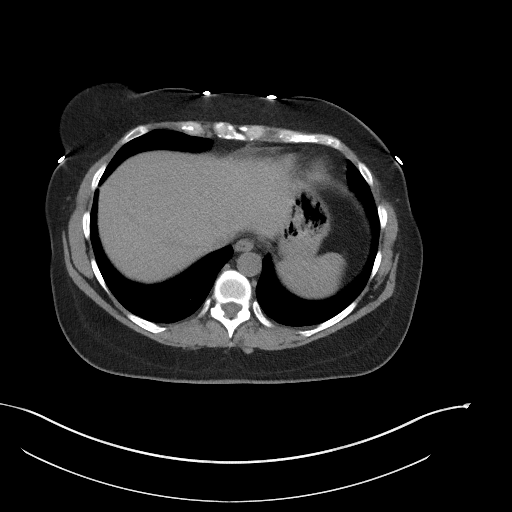
[im 88/93  soft-tissue]
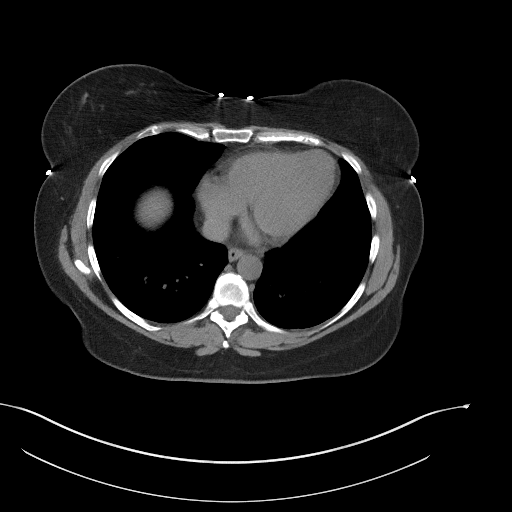

[Series 5: coronal st · coronal · 0.89mm/px · 3 of 112 slices shown]
[im 38/112  soft-tissue]
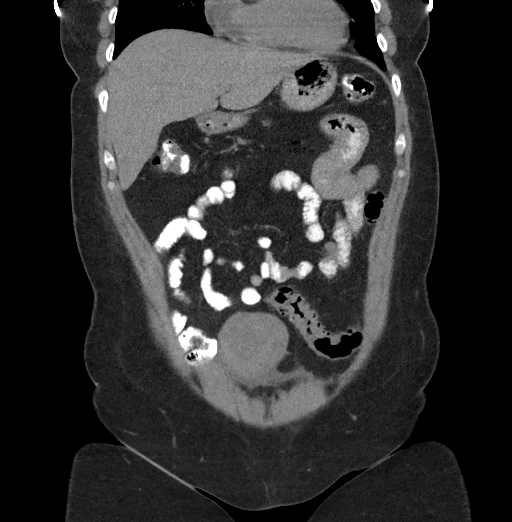
[im 50/112  soft-tissue]
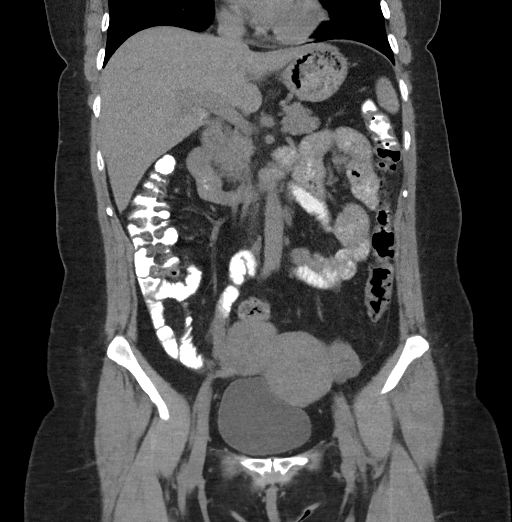
[im 62/112  soft-tissue]
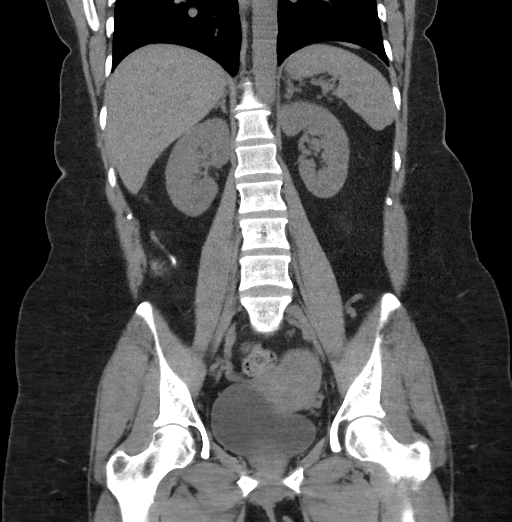

[17 of 46 positions shown; findings below may reference images not displayed]

FINDINGS: Lower chest: Lung bases clear

Hepatobiliary: Gallbladder surgically absent. Liver normal
appearance

Pancreas: Normal appearance

Spleen: Normal appearance

Adrenals/Urinary Tract: Adrenal glands, kidneys, ureters, and
bladder normal appearance

Stomach/Bowel: Normal appendix, retrocecal. Minimal sigmoid
diverticulosis without evidence of diverticulitis. Stomach and bowel
loops otherwise normal appearance.

Vascular/Lymphatic: Aorta normal caliber. Few scattered normal sized
retroperitoneal and mesenteric lymph nodes. No adenopathy. Scattered
pelvic phleboliths.

Reproductive: Large RIGHT adnexal mass, appears contiguous with the
RIGHT anterior superior aspect of the uterus, favor large exophytic
uterine leiomyoma 6.8 x 6.6 x 6.2 cm, with RIGHT ovary located just
posterior to the mass. Remainder of uterus and LEFT ovary
unremarkable.

Other: Minimal free fluid.  No free air.  No hernia.

Musculoskeletal: Unremarkable
IMPRESSION: Normal appendix.

6.8 cm diameter mass adjacent to and suspect contiguous with the
anterior RIGHT superior aspect of the uterus, favor exophytic
leiomyoma.

No acute intra-abdominal or intrapelvic abnormalities.

## 2022-02-08 IMAGING — DX DG ABDOMEN 1V
2 series · 2 of 2 positions shown · non-contrast
Comparison: None

CLINICAL DATA: RIGHT lower quadrant abdominal pain

EXAM:
ABDOMEN - 1 VIEW

[abdomen kub (1 of 2)]
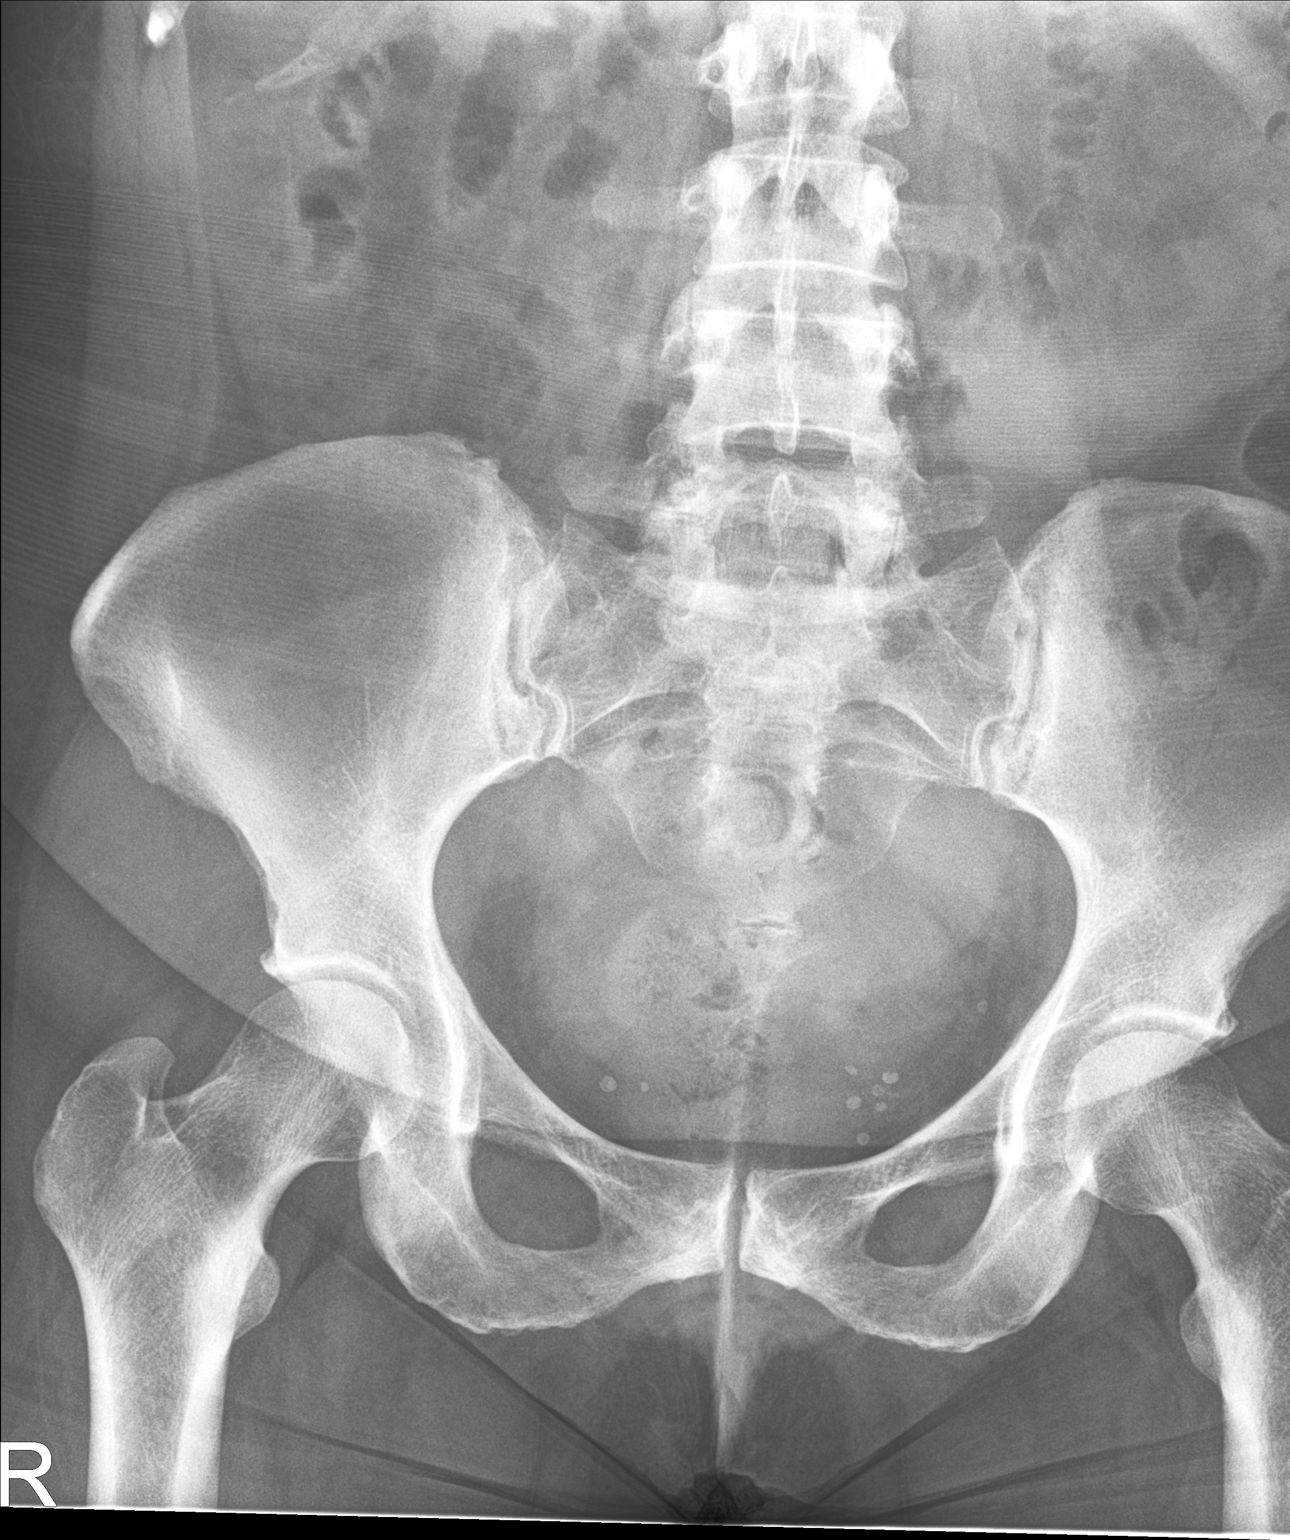

[abdomen kub (2 of 2)]
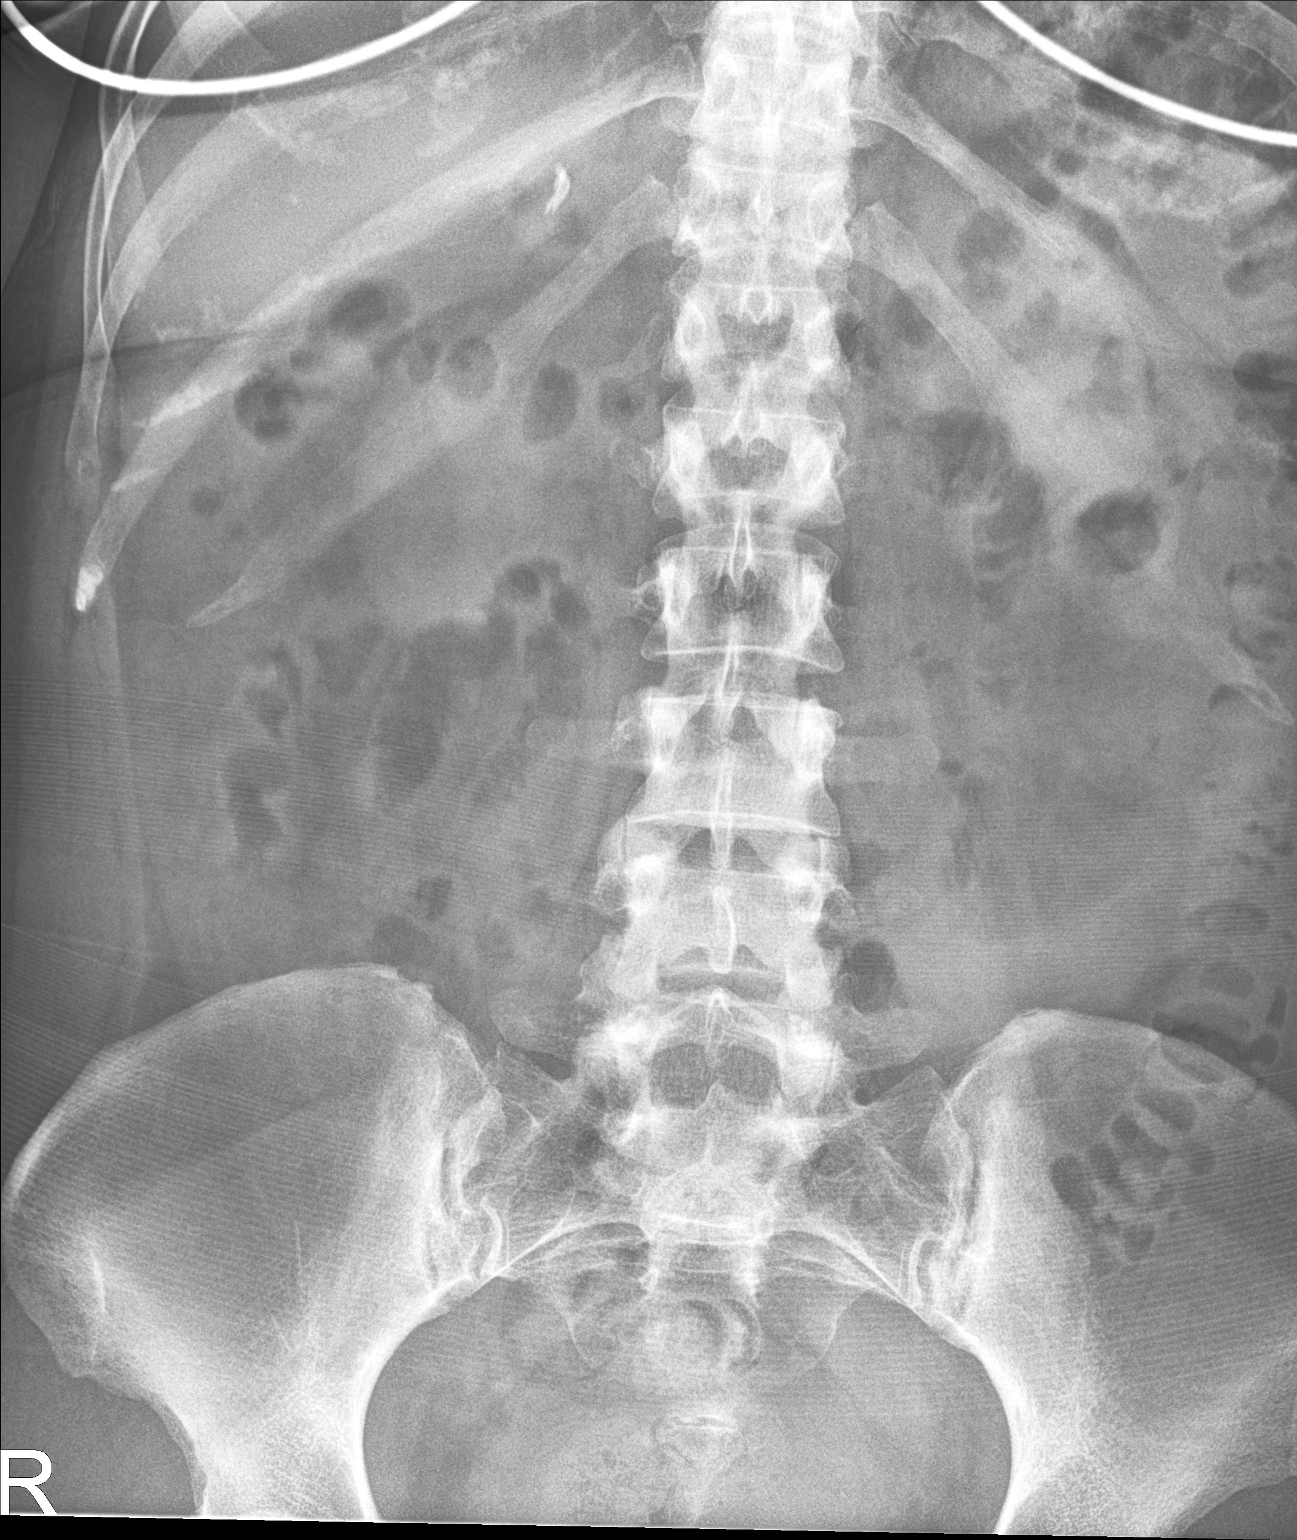

[2 of 2 positions shown; findings below may reference images not displayed]

FINDINGS: No urinary tract calcification.

Small pelvic phleboliths.

Surgical clips RIGHT upper quadrant likely reflect prior
cholecystectomy.

Osseous structures unremarkable.
IMPRESSION: No acute abnormalities.

## 2022-02-14 ENCOUNTER — Telehealth: Payer: Self-pay | Admitting: Family Medicine

## 2022-02-14 NOTE — Telephone Encounter (Signed)
If still symptomatic, she needs to be reevaluated.

## 2022-02-14 NOTE — Telephone Encounter (Signed)
°  Prescription Request  02/14/2022  Is this a "Controlled Substance" medicine? no  Have you seen your PCP in the last 2 weeks? yes  If YES, route message to pool  -  If NO, patient needs to be scheduled for appointment.  What is the name of the medication or equipment? Pt finished prednisone. She still has sinus infection with yellow drainage. She wants antibiotic called in  Have you contacted your pharmacy to request a refill? no   Which pharmacy would you like this sent to? [facility]   Patient notified that their request is being sent to the clinical staff for review and that they should receive a response within 2 business days.

## 2022-02-15 NOTE — Telephone Encounter (Signed)
LMTCB

## 2022-02-19 NOTE — Telephone Encounter (Signed)
Called patient, no answer 

## 2022-11-07 ENCOUNTER — Encounter: Payer: Self-pay | Admitting: Nurse Practitioner

## 2022-11-07 ENCOUNTER — Ambulatory Visit (INDEPENDENT_AMBULATORY_CARE_PROVIDER_SITE_OTHER): Payer: 59 | Admitting: Nurse Practitioner

## 2022-11-07 ENCOUNTER — Ambulatory Visit (INDEPENDENT_AMBULATORY_CARE_PROVIDER_SITE_OTHER): Payer: 59

## 2022-11-07 VITALS — BP 153/102 | HR 79 | Temp 98.3°F | Resp 20 | Ht 63.0 in | Wt 206.0 lb

## 2022-11-07 DIAGNOSIS — R051 Acute cough: Secondary | ICD-10-CM | POA: Diagnosis not present

## 2022-11-07 MED ORDER — AZITHROMYCIN 250 MG PO TABS: ORAL_TABLET | ORAL | 0 refills | Status: DC

## 2022-11-07 MED ORDER — PREDNISONE 20 MG PO TABS: 40.0000 mg | ORAL_TABLET | Freq: Every day | ORAL | 0 refills | Status: AC

## 2022-11-07 NOTE — Patient Instructions (Signed)

## 2022-11-07 NOTE — Progress Notes (Signed)
Subjective:    Patient ID: Vanessa Harrington, female    DOB: 06/03/1976, 46 y.o.   MRN: [id number]   Chief Complaint: Cough (For 3 weeks/)   Cough This is a new problem. The current episode started 1 to 4 weeks ago. The problem has been waxing and waning. The cough is Productive of sputum. Associated symptoms include rhinorrhea. Pertinent negatives include no chills, ear congestion, fever, heartburn, sore throat, shortness of breath or wheezing. Nothing aggravates the symptoms. She has tried OTC cough suppressant and ipratropium inhaler for the symptoms. The treatment provided moderate relief.        Review of Systems  Constitutional:  Negative for chills and fever.  HENT:  Positive for congestion and rhinorrhea. Negative for sore throat.   Respiratory:  Positive for cough. Negative for shortness of breath and wheezing.   Gastrointestinal:  Negative for heartburn.       Objective:   Physical Exam Vitals reviewed.  Constitutional:      Appearance: Normal appearance.  Cardiovascular:     Rate and Rhythm: Regular rhythm.     Heart sounds: Normal heart sounds.     Comments: Deep wet cough  Pulmonary:     Effort: Pulmonary effort is normal.     Breath sounds: Normal breath sounds.  Skin:    General: Skin is warm.  Neurological:     General: No focal deficit present.     Mental Status: She is alert and oriented to person, place, and time.  Psychiatric:        Mood and Affect: Mood normal.        Behavior: Behavior normal.      Chest xray- no pneumonia BP (!) 153/102   Pulse 79   Temp 98.3 F (36.8 C) (Temporal)   Resp 20   Ht '5\' 3"'$  (1.6 m)   Wt 206 lb (93.4 kg)   SpO2 98%   BMI 36.49 kg/m      Assessment & Plan:   Vanessa Harrington in today with chief complaint of Cough (For 3 weeks/)   1. Acute cough 1. Take meds as prescribed 2. Use a cool mist humidifier especially during the winter months and when heat has been humid. 3. Use saline nose sprays  frequently 4. Saline irrigations of the nose can be very helpful if done frequently.  * 4X daily for 1 week*  * Use of a nettie pot can be helpful with this. Follow directions with this* 5. Drink plenty of fluids 6. Keep thermostat turn down low 7.For any cough or congestion- mucinex OTC 8. For fever or aces or pains- take tylenol or ibuprofen appropriate for age and weight.  * for fevers greater than 101 orally you may alternate ibuprofen and tylenol every  3 hours.    - DG Chest 2 View  Meds ordered this encounter  Medications   azithromycin (ZITHROMAX Z-PAK) 250 MG tablet    Sig: As directed    Dispense:  6 tablet    Refill:  0    Order Specific Question:   Supervising Provider    Answer:   Caryl Pina A [[id number]]   predniSONE (DELTASONE) 20 MG tablet    Sig: Take 2 tablets (40 mg total) by mouth daily with breakfast for 5 days. 2 po daily for 5 days    Dispense:  10 tablet    Refill:  0    Order Specific Question:   Supervising Provider    Answer:  DETTINGER, JOSHUA A [[id number]]     The above assessment and management plan was discussed with the patient. The patient verbalized understanding of and has agreed to the management plan. Patient is aware to call the clinic if symptoms persist or worsen. Patient is aware when to return to the clinic for a follow-up visit. Patient educated on when it is appropriate to go to the emergency department.   Mary-Margaret Hassell Done, FNP

## 2023-06-20 ENCOUNTER — Encounter: Payer: Self-pay | Admitting: Family Medicine

## 2023-09-02 ENCOUNTER — Encounter: Payer: Self-pay | Admitting: Family Medicine

## 2023-09-02 ENCOUNTER — Ambulatory Visit: Payer: No Typology Code available for payment source | Admitting: Family Medicine

## 2023-09-02 VITALS — BP 137/85 | HR 118 | Temp 98.6°F | Ht 63.0 in | Wt 211.6 lb

## 2023-09-02 DIAGNOSIS — R1084 Generalized abdominal pain: Secondary | ICD-10-CM

## 2023-09-02 DIAGNOSIS — N3001 Acute cystitis with hematuria: Secondary | ICD-10-CM | POA: Diagnosis not present

## 2023-09-02 DIAGNOSIS — B49 Unspecified mycosis: Secondary | ICD-10-CM

## 2023-09-02 DIAGNOSIS — R509 Fever, unspecified: Secondary | ICD-10-CM | POA: Diagnosis not present

## 2023-09-02 DIAGNOSIS — B379 Candidiasis, unspecified: Secondary | ICD-10-CM

## 2023-09-02 LAB — MICROSCOPIC EXAMINATION: Renal Epithel, UA: NONE SEEN /HPF

## 2023-09-02 LAB — URINALYSIS, ROUTINE W REFLEX MICROSCOPIC
Bilirubin, UA: NEGATIVE
Glucose, UA: NEGATIVE
Nitrite, UA: NEGATIVE
Specific Gravity, UA: 1.02 (ref 1.005–1.030)
Urobilinogen, Ur: 4 mg/dL — ABNORMAL HIGH (ref 0.2–1.0)
pH, UA: 6 (ref 5.0–7.5)

## 2023-09-02 LAB — PREGNANCY, URINE: Preg Test, Ur: NEGATIVE

## 2023-09-02 MED ORDER — FLUCONAZOLE 150 MG PO TABS
150.0000 mg | ORAL_TABLET | Freq: Once | ORAL | 0 refills | Status: AC
Start: 2023-09-02 — End: 2023-09-02

## 2023-09-02 MED ORDER — CEFTRIAXONE SODIUM 1 G IJ SOLR
1.0000 g | Freq: Once | INTRAMUSCULAR | Status: AC
Start: 2023-09-02 — End: 2023-09-02
  Administered 2023-09-02: 1 g via INTRAMUSCULAR

## 2023-09-02 NOTE — Progress Notes (Signed)
Subjective:  Patient ID: Vanessa Harrington, female    DOB: [birth year], 47 y.o.   MRN: [id number]  Patient Care Team: Mechele Claude, MD as PCP - General (Family Medicine)   Chief Complaint:  Abdominal Pain Micah Flesher to [facility] on Sunday - said she had blood in urine. )   HPI: Vanessa Harrington is a 47 y.o. female presenting on 09/02/2023 for Abdominal Pain (Went to [facility] on Sunday - said she had blood in urine. )   Pt presents today with ongoing abdominal pain and dysuria. She was seen at an UC on Sunday and placed on bactrim for an UTI. She continues to have abdominal pain and intermittent fevers. No nausea, vomiting, or diarrhea. No weakness, confusion, or fatigue. Has been eating and drinking normally.   Abdominal Pain This is a new problem. The current episode started in the past 7 days. The problem occurs intermittently. The problem has been waxing and waning. The pain is located in the LLQ, generalized abdominal region, periumbilical region and suprapubic region. The pain is moderate. The quality of the pain is aching and sharp. Associated symptoms include dysuria, a fever and frequency. Pertinent negatives include no anorexia, arthralgias, belching, constipation, diarrhea, flatus, headaches, hematochezia, hematuria, melena, myalgias, nausea, vomiting or weight loss. Nothing aggravates the pain. The pain is relieved by Nothing. She has tried antibiotics (started on Monday - Bactrim) for the symptoms. The treatment provided no relief.     Relevant past medical, surgical, family, and social history reviewed and updated as indicated.  Allergies and medications reviewed and updated. Data reviewed: Chart in Epic.   Past Medical History:  Diagnosis Date   Anxiety    Blood pressure alteration    "blood pressure goes up and down"   Depression    Exposure to bat without known bite 10/30/2019   Hypertension    Increased intracranial pressure    Pseudotumor  cerebri    Uterine fibroid     Past Surgical History:  Procedure Laterality Date   CESAREAN SECTION  1999   CHOLECYSTECTOMY  2015   MOUTH SURGERY  2005   TUBAL LIGATION  2001    Social History   Socioeconomic History   Marital status: Married    Spouse name: Not on file   Number of children: 3   Years of education: 14   Highest education level: Associate degree: academic program  Occupational History   Not on file  Tobacco Use   Smoking status: Former    Current packs/day: 0.00    Types: Cigarettes    Quit date: 2008    Years since quitting: 16.7   Smokeless tobacco: Never  Vaping Use   Vaping status: Never Used  Substance and Sexual Activity   Alcohol use: Never   Drug use: Never   Sexual activity: Yes    Birth control/protection: Surgical  Other Topics Concern   Not on file  Social History Narrative   Lives at home with husband & kids   Right handed   Caffeine: 3 cans/day; update 01/25/2020 stopped drinking caffeine because of the tremors   Social Determinants of Health   Financial Resource Strain: Not on file  Food Insecurity: Not on file  Transportation Needs: Not on file  Physical Activity: Not on file  Stress: Not on file  Social Connections: Unknown (05/07/2022)   Received from [facility]   Social Network    Social Network: Not on file  Intimate Partner  Violence: Unknown (03/29/2022)   Received from [facility]   HITS    Physically Hurt: Not on file    Insult or Talk Down To: Not on file    Threaten Physical Harm: Not on file    Scream or Curse: Not on file    Outpatient Encounter Medications as of 09/02/2023  Medication Sig   albuterol (VENTOLIN HFA) 108 (90 Base) MCG/ACT inhaler Inhale 2 puffs into the lungs every 6 (six) hours as needed for wheezing or shortness of breath.   Ascorbic Acid (VITAMIN C) 1000 MG tablet Take 1,000 mg by mouth daily.   fluconazole (DIFLUCAN) 150 MG tablet Take 1 tablet (150 mg total) by mouth once for 1 dose.    fluticasone (FLONASE) 50 MCG/ACT nasal spray Place 2 sprays into both nostrils daily.   Magnesium 250 MG TABS Take by mouth.   nitrofurantoin (MACRODANTIN) 100 MG capsule Take 100 mg by mouth 2 (two) times daily.   [DISCONTINUED] azithromycin (ZITHROMAX Z-PAK) 250 MG tablet As directed   [DISCONTINUED] diclofenac (VOLTAREN) 75 MG EC tablet Take 1 tablet (75 mg total) by mouth 2 (two) times daily. For muscle and  Joint pain   [EXPIRED] cefTRIAXone (ROCEPHIN) injection 1 g    No facility-administered encounter medications on file as of 09/02/2023.    Allergies  Allergen Reactions   Diamox [Acetazolamide] Rash    Due to sulfa allergy   Sulfa Antibiotics Rash    Review of Systems  Constitutional:  Positive for activity change, appetite change and fever. Negative for chills, diaphoresis, fatigue, unexpected weight change and weight loss.  HENT: Negative.    Eyes: Negative.   Respiratory:  Negative for cough, chest tightness and shortness of breath.   Cardiovascular:  Negative for chest pain, palpitations and leg swelling.  Gastrointestinal:  Positive for abdominal pain. Negative for abdominal distention, anal bleeding, anorexia, blood in stool, constipation, diarrhea, flatus, hematochezia, melena, nausea, rectal pain and vomiting.  Endocrine: Negative.   Genitourinary:  Positive for dysuria, flank pain and frequency. Negative for decreased urine volume, difficulty urinating, dyspareunia, enuresis, genital sores, hematuria, menstrual problem, pelvic pain, urgency, vaginal bleeding, vaginal discharge and vaginal pain.  Musculoskeletal:  Negative for arthralgias and myalgias.  Skin: Negative.   Allergic/Immunologic: Negative.   Neurological:  Negative for dizziness, tremors, seizures, syncope, facial asymmetry, speech difficulty, weakness, light-headedness, numbness and headaches.  Hematological: Negative.   Psychiatric/Behavioral:  Negative for confusion, hallucinations, sleep disturbance and  suicidal ideas.   All other systems reviewed and are negative.       Objective:  BP 137/85   Pulse (!) 118   Temp 98.6 F (37 C) (Temporal)   Ht 5\' 3"  (1.6 m)   Wt 211 lb 9.6 oz (96 kg)   SpO2 96%   BMI 37.48 kg/m    Wt Readings from Last 3 Encounters:  09/02/23 211 lb 9.6 oz (96 kg)  11/07/22 206 lb (93.4 kg)  02/07/22 201 lb (91.2 kg)    Physical Exam Vitals and nursing note reviewed.  Constitutional:      General: She is not in acute distress.    Appearance: Normal appearance. She is well-developed and well-groomed. She is obese. She is not ill-appearing, toxic-appearing or diaphoretic.  HENT:     Head: Normocephalic and atraumatic.     Jaw: There is normal jaw occlusion.     Right Ear: Hearing normal.     Left Ear: Hearing normal.     Nose: Nose normal.  Mouth/Throat:     Lips: Pink.     Mouth: Mucous membranes are moist.     Pharynx: Oropharynx is clear. Uvula midline.  Eyes:     General: Lids are normal.     Extraocular Movements: Extraocular movements intact.     Conjunctiva/sclera: Conjunctivae normal.     Pupils: Pupils are equal, round, and reactive to light.  Neck:     Thyroid: No thyroid mass, thyromegaly or thyroid tenderness.     Vascular: No carotid bruit or JVD.     Trachea: Trachea and phonation normal.  Cardiovascular:     Rate and Rhythm: Normal rate and regular rhythm.     Chest Wall: PMI is not displaced.     Pulses: Normal pulses.     Heart sounds: Normal heart sounds. No murmur heard.    No friction rub. No gallop.  Pulmonary:     Effort: Pulmonary effort is normal. No respiratory distress.     Breath sounds: Normal breath sounds. No wheezing.  Abdominal:     General: Bowel sounds are normal. There is no distension or abdominal bruit.     Palpations: Abdomen is soft. There is no hepatomegaly or splenomegaly.     Tenderness: There is abdominal tenderness in the periumbilical area, suprapubic area and left lower quadrant. There is no  right CVA tenderness, left CVA tenderness, guarding or rebound. Negative signs include Murphy's sign, Rovsing's sign, McBurney's sign, psoas sign and obturator sign.     Hernia: No hernia is present.  Musculoskeletal:        General: Normal range of motion.     Cervical back: Normal range of motion and neck supple.     Right lower leg: No edema.     Left lower leg: No edema.  Lymphadenopathy:     Cervical: No cervical adenopathy.  Skin:    General: Skin is warm and dry.     Capillary Refill: Capillary refill takes less than 2 seconds.     Coloration: Skin is not cyanotic, jaundiced or pale.     Findings: No rash.  Neurological:     General: No focal deficit present.     Mental Status: She is alert and oriented to person, place, and time.     Sensory: Sensation is intact.     Motor: Motor function is intact.     Coordination: Coordination is intact.     Gait: Gait is intact.     Deep Tendon Reflexes: Reflexes are normal and symmetric.  Psychiatric:        Attention and Perception: Attention and perception normal.        Mood and Affect: Mood and affect normal.        Speech: Speech normal.        Behavior: Behavior normal. Behavior is cooperative.        Thought Content: Thought content normal.        Cognition and Memory: Cognition and memory normal.        Judgment: Judgment normal.     Results for orders placed or performed in visit on 09/02/23  Microscopic Examination   Urine  Result Value Ref Range   WBC, UA 6-10 (A) 0 - 5 /hpf   RBC, Urine 0-2 0 - 2 /hpf   Epithelial Cells (non renal) 0-10 0 - 10 /hpf   Renal Epithel, UA None seen None seen /hpf   Mucus, UA Present (A) Not Estab.   Bacteria, UA Few (A) None  seen/Few   Yeast, UA Present (A) None seen  Urinalysis, Routine w reflex microscopic  Result Value Ref Range   Specific Gravity, UA 1.020 1.005 - 1.030   pH, UA 6.0 5.0 - 7.5   Color, UA Yellow Yellow   Appearance Ur Clear Clear   Leukocytes,UA 1+ (A)  Negative   Protein,UA 1+ (A) Negative/Trace   Glucose, UA Negative Negative   Ketones, UA Trace (A) Negative   RBC, UA 2+ (A) Negative   Bilirubin, UA Negative Negative   Urobilinogen, Ur 4.0 (H) 0.2 - 1.0 mg/dL   Nitrite, UA Negative Negative   Microscopic Examination See below:   Pregnancy, urine  Result Value Ref Range   Preg Test, Ur Negative Negative       Pertinent labs & imaging results that were available during my care of the patient were reviewed by me and considered in my medical decision making.  Assessment & Plan:  Mariangel was seen today for abdominal pain.  Diagnoses and all orders for this visit:  Generalized abdominal pain Acute cystitis with hematuria Fever and chills Pregnancy negative. Urinalysis positive for blood, protein, ketones, leukocytes, yeast, and bacteria. Tenderness to left abdomen on exam. Will order STAT CT as there is concerns for obstructed renal calculi vs acute pyelonephritis vs diverticulitis. Rocephin 1 gm IM given in office. Further treatment pending CT results. Pt aware of red flags which require emergent follow up or ED visit.  -     Pregnancy, urine -     CT RENAL STONE STUDY; Future -     Urine Culture -     cefTRIAXone (ROCEPHIN) injection 1 g -     Microscopic Examination  Yeast cells and fungal elements present on diagnostic testing Yeast present on urinalysis, will treat with below. Culture pending.  -     fluconazole (DIFLUCAN) 150 MG tablet; Take 1 tablet (150 mg total) by mouth once for 1 dose.     Continue all other maintenance medications.  Follow up plan: Return if symptoms worsen or fail to improve.   Continue healthy lifestyle choices, including diet (rich in fruits, vegetables, and lean proteins, and low in salt and simple carbohydrates) and exercise (at least 30 minutes of moderate physical activity daily).    The above assessment and management plan was discussed with the patient. The patient verbalized  understanding of and has agreed to the management plan. Patient is aware to call the clinic if they develop any new symptoms or if symptoms persist or worsen. Patient is aware when to return to the clinic for a follow-up visit. Patient educated on when it is appropriate to go to the emergency department.   Kari Baars, FNP-C [facility] [phone]

## 2023-09-03 LAB — URINE CULTURE: Organism ID, Bacteria: NO GROWTH

## 2023-09-04 NOTE — Telephone Encounter (Signed)
Has been faxed.

## 2023-09-04 NOTE — Telephone Encounter (Signed)
Patient's CT has been approved, Please fax STAT signed order to [facility]) at [phone] for scheduling.

## 2023-09-04 NOTE — Telephone Encounter (Signed)
Can we print this order out and fax it?

## 2023-09-05 ENCOUNTER — Ambulatory Visit (HOSPITAL_COMMUNITY)
Admission: RE | Admit: 2023-09-05 | Discharge: 2023-09-05 | Disposition: A | Discharge status: Home / Self Care | Payer: No Typology Code available for payment source | Source: Ambulatory Visit | Attending: Family Medicine | Admitting: Family Medicine

## 2023-09-05 DIAGNOSIS — R1084 Generalized abdominal pain: Secondary | ICD-10-CM

## 2023-09-05 DIAGNOSIS — N3001 Acute cystitis with hematuria: Secondary | ICD-10-CM | POA: Diagnosis present

## 2023-09-05 DIAGNOSIS — R509 Fever, unspecified: Secondary | ICD-10-CM | POA: Diagnosis present

## 2024-10-20 LAB — HM MAMMOGRAPHY
